# Patient Record
Sex: Female | Born: 1965 | Race: White | Hispanic: No | Marital: Married | Smoking: Current every day smoker
Health system: Southern US, Community
[De-identification: ages and names within clinical notes are randomized; demographics above are authoritative.]

## PROBLEM LIST (undated history)

## (undated) DIAGNOSIS — M1990 Unspecified osteoarthritis, unspecified site: Secondary | ICD-10-CM

## (undated) DIAGNOSIS — M797 Fibromyalgia: Secondary | ICD-10-CM

## (undated) DIAGNOSIS — J449 Chronic obstructive pulmonary disease, unspecified: Secondary | ICD-10-CM

## (undated) DIAGNOSIS — C569 Malignant neoplasm of unspecified ovary: Secondary | ICD-10-CM

## (undated) HISTORY — DX: Chronic obstructive pulmonary disease, unspecified: J44.9

## (undated) HISTORY — PX: KNEE ARTHROSCOPY: SUR90

## (undated) HISTORY — PX: TONSILLECTOMY AND ADENOIDECTOMY: SUR1326

## (undated) HISTORY — DX: Fibromyalgia: M79.7

## (undated) HISTORY — DX: Unspecified osteoarthritis, unspecified site: M19.90

## (undated) HISTORY — PX: UMBILICAL HERNIA REPAIR: SHX196

## (undated) HISTORY — PX: ABDOMINAL HYSTERECTOMY: SHX81

## (undated) HISTORY — PX: MELANOMA EXCISION: SHX5266

## (undated) HISTORY — PX: TUBAL LIGATION: SHX77

---

## 2000-11-02 ENCOUNTER — Encounter: Payer: Self-pay | Admitting: Obstetrics and Gynecology

## 2000-11-02 ENCOUNTER — Ambulatory Visit (HOSPITAL_COMMUNITY): Admission: RE | Admit: 2000-11-02 | Discharge: 2000-11-02 | Payer: Self-pay | Admitting: Obstetrics and Gynecology

## 2000-11-07 ENCOUNTER — Inpatient Hospital Stay (HOSPITAL_COMMUNITY): Admission: RE | Admit: 2000-11-07 | Discharge: 2000-11-09 | Payer: Self-pay | Admitting: Obstetrics and Gynecology

## 2000-11-11 ENCOUNTER — Inpatient Hospital Stay (HOSPITAL_COMMUNITY): Admission: AD | Admit: 2000-11-11 | Discharge: 2000-11-17 | Payer: Self-pay | Admitting: Obstetrics and Gynecology

## 2000-11-14 ENCOUNTER — Encounter: Payer: Self-pay | Admitting: Obstetrics and Gynecology

## 2000-11-15 ENCOUNTER — Encounter: Payer: Self-pay | Admitting: Obstetrics and Gynecology

## 2000-11-25 ENCOUNTER — Emergency Department (HOSPITAL_COMMUNITY): Admission: EM | Admit: 2000-11-25 | Discharge: 2000-11-25 | Payer: Self-pay | Admitting: *Deleted

## 2002-09-27 ENCOUNTER — Ambulatory Visit (HOSPITAL_COMMUNITY): Admission: RE | Admit: 2002-09-27 | Discharge: 2002-09-27 | Payer: Self-pay | Admitting: Pulmonary Disease

## 2003-07-24 ENCOUNTER — Ambulatory Visit (HOSPITAL_COMMUNITY): Admission: RE | Admit: 2003-07-24 | Discharge: 2003-07-24 | Payer: Self-pay | Admitting: Pulmonary Disease

## 2003-09-10 ENCOUNTER — Ambulatory Visit (HOSPITAL_COMMUNITY): Admission: RE | Admit: 2003-09-10 | Discharge: 2003-09-10 | Payer: Self-pay | Admitting: Orthopedic Surgery

## 2003-09-12 ENCOUNTER — Encounter (HOSPITAL_COMMUNITY): Admission: RE | Admit: 2003-09-12 | Discharge: 2003-10-12 | Payer: Self-pay | Admitting: Orthopedic Surgery

## 2003-10-16 ENCOUNTER — Encounter (HOSPITAL_COMMUNITY): Admission: RE | Admit: 2003-10-16 | Discharge: 2003-10-18 | Payer: Self-pay | Admitting: Orthopedic Surgery

## 2003-10-21 ENCOUNTER — Encounter (HOSPITAL_COMMUNITY): Admission: RE | Admit: 2003-10-21 | Discharge: 2003-11-20 | Payer: Self-pay | Admitting: Orthopedic Surgery

## 2005-02-07 ENCOUNTER — Emergency Department (HOSPITAL_COMMUNITY): Admission: EM | Admit: 2005-02-07 | Discharge: 2005-02-07 | Payer: Self-pay | Admitting: Emergency Medicine

## 2005-09-08 ENCOUNTER — Ambulatory Visit (HOSPITAL_COMMUNITY): Admission: RE | Admit: 2005-09-08 | Discharge: 2005-09-08 | Payer: Self-pay | Admitting: Pulmonary Disease

## 2005-09-16 ENCOUNTER — Ambulatory Visit (HOSPITAL_COMMUNITY): Payer: Self-pay | Admitting: Psychology

## 2005-10-13 ENCOUNTER — Ambulatory Visit (HOSPITAL_COMMUNITY): Payer: Self-pay | Admitting: Psychology

## 2005-10-26 ENCOUNTER — Ambulatory Visit (HOSPITAL_COMMUNITY): Payer: Self-pay | Admitting: Psychology

## 2005-11-17 ENCOUNTER — Ambulatory Visit (HOSPITAL_COMMUNITY): Payer: Self-pay | Admitting: Psychology

## 2006-07-11 ENCOUNTER — Ambulatory Visit (HOSPITAL_COMMUNITY): Admission: RE | Admit: 2006-07-11 | Discharge: 2006-07-11 | Payer: Self-pay | Admitting: Diagnostic Radiology

## 2006-09-22 ENCOUNTER — Ambulatory Visit (HOSPITAL_COMMUNITY): Admission: RE | Admit: 2006-09-22 | Discharge: 2006-09-22 | Payer: Self-pay | Admitting: Pulmonary Disease

## 2006-12-14 ENCOUNTER — Ambulatory Visit (HOSPITAL_COMMUNITY): Admission: RE | Admit: 2006-12-14 | Discharge: 2006-12-14 | Payer: Self-pay | Admitting: Pulmonary Disease

## 2007-10-23 ENCOUNTER — Ambulatory Visit (HOSPITAL_COMMUNITY): Admission: RE | Admit: 2007-10-23 | Discharge: 2007-10-23 | Payer: Self-pay | Admitting: Pulmonary Disease

## 2007-11-07 ENCOUNTER — Ambulatory Visit (HOSPITAL_COMMUNITY): Admission: RE | Admit: 2007-11-07 | Discharge: 2007-11-07 | Payer: Self-pay | Admitting: Pulmonary Disease

## 2007-11-23 ENCOUNTER — Ambulatory Visit (HOSPITAL_COMMUNITY): Admission: RE | Admit: 2007-11-23 | Discharge: 2007-11-23 | Payer: Self-pay | Admitting: Neurological Surgery

## 2008-07-23 ENCOUNTER — Ambulatory Visit (HOSPITAL_COMMUNITY): Admission: RE | Admit: 2008-07-23 | Discharge: 2008-07-23 | Payer: Self-pay | Admitting: Cardiology

## 2008-09-26 ENCOUNTER — Encounter: Payer: Self-pay | Admitting: Cardiology

## 2008-09-26 LAB — CONVERTED CEMR LAB
AST: 16 units/L (ref 0–37)
Albumin: 4.2 g/dL (ref 3.5–5.2)
Alkaline Phosphatase: 90 units/L (ref 39–117)
CO2: 23 meq/L (ref 19–32)
Calcium: 9 mg/dL (ref 8.4–10.5)
Cholesterol: 225 mg/dL — ABNORMAL HIGH (ref 0–200)
Eosinophils Relative: 3 % (ref 0–5)
HDL: 42 mg/dL (ref >39)
Hemoglobin: 14.2 g/dL (ref 12.0–15.0)
LDL Cholesterol: 133 mg/dL — ABNORMAL HIGH (ref 0–99)
Lymphocytes Relative: 25 % (ref 12–46)
MCHC: 31.9 g/dL (ref 30.0–36.0)
MCV: 89.9 fL (ref 78.0–100.0)
Monocytes Absolute: 1 10*3/uL (ref 0.1–1.0)
Monocytes Relative: 7 % (ref 3–12)
Neutro Abs: 8.9 10*3/uL — ABNORMAL HIGH (ref 1.7–7.7)
Neutrophils Relative %: 65 % (ref 43–77)
Platelets: 236 10*3/uL (ref 150–400)
RBC: 4.95 M/uL (ref 3.87–5.11)
RDW: 15.3 % (ref 11.5–15.5)
Rhuematoid fact SerPl-aCnc: 22 intl units/mL — ABNORMAL HIGH (ref 0–20)
Total CHOL/HDL Ratio: 5.4
VLDL: 50 mg/dL — ABNORMAL HIGH (ref 0–40)

## 2009-06-03 ENCOUNTER — Ambulatory Visit (HOSPITAL_COMMUNITY): Admission: RE | Admit: 2009-06-03 | Discharge: 2009-06-03 | Payer: Self-pay | Admitting: Pulmonary Disease

## 2009-06-05 ENCOUNTER — Ambulatory Visit (HOSPITAL_COMMUNITY): Admission: RE | Admit: 2009-06-05 | Discharge: 2009-06-05 | Payer: Self-pay | Admitting: Pulmonary Disease

## 2009-08-21 ENCOUNTER — Ambulatory Visit (HOSPITAL_COMMUNITY): Admission: RE | Admit: 2009-08-21 | Discharge: 2009-08-21 | Payer: Self-pay | Admitting: Pulmonary Disease

## 2009-11-10 ENCOUNTER — Ambulatory Visit: Payer: Self-pay | Admitting: Cardiology

## 2009-11-10 ENCOUNTER — Encounter: Payer: Self-pay | Admitting: *Deleted

## 2009-11-10 DIAGNOSIS — R0789 Other chest pain: Secondary | ICD-10-CM | POA: Insufficient documentation

## 2009-11-10 DIAGNOSIS — E785 Hyperlipidemia, unspecified: Secondary | ICD-10-CM | POA: Insufficient documentation

## 2009-11-11 ENCOUNTER — Encounter: Payer: Self-pay | Admitting: Cardiology

## 2009-11-11 ENCOUNTER — Encounter (INDEPENDENT_AMBULATORY_CARE_PROVIDER_SITE_OTHER): Payer: Self-pay

## 2009-11-13 ENCOUNTER — Encounter: Payer: Self-pay | Admitting: Cardiology

## 2009-11-17 ENCOUNTER — Encounter: Payer: Self-pay | Admitting: Cardiology

## 2009-11-18 ENCOUNTER — Encounter: Payer: Self-pay | Admitting: Cardiology

## 2009-11-18 LAB — CONVERTED CEMR LAB
Calcium: 9.2 mg/dL (ref 8.4–10.5)
Eosinophils Relative: 3 % (ref 0–5)
HCT: 43.8 % (ref 36.0–46.0)
INR: 1 (ref <1.50)
Monocytes Relative: 7 % (ref 3–12)
Neutro Abs: 10.7 10*3/uL — ABNORMAL HIGH (ref 1.7–7.7)
Neutrophils Relative %: 67 % (ref 43–77)
Prothrombin Time: 13.4 s (ref 11.6–15.2)
RBC: 5.07 M/uL (ref 3.87–5.11)
Sodium: 142 meq/L (ref 135–145)

## 2009-11-19 ENCOUNTER — Inpatient Hospital Stay (HOSPITAL_BASED_OUTPATIENT_CLINIC_OR_DEPARTMENT_OTHER): Admission: RE | Admit: 2009-11-19 | Discharge: 2009-11-19 | Payer: Self-pay | Admitting: Cardiovascular Disease

## 2009-11-19 ENCOUNTER — Ambulatory Visit: Payer: Self-pay | Admitting: Cardiovascular Disease

## 2010-02-08 ENCOUNTER — Encounter: Payer: Self-pay | Admitting: Pulmonary Disease

## 2010-02-09 ENCOUNTER — Encounter: Payer: Self-pay | Admitting: Pulmonary Disease

## 2010-02-17 NOTE — Letter (Signed)
Summary: [place] [employer] [facility], agricultural at [facility]  [address] [place] [address]   Phone: [phone]  Fax: [phone]      November 18, 2009 MRN: [id number]   Felicia Wallace [address] [place], [place]  [address]   Dear Ms. Stefanik,  Your test ordered by Selena Batten has been reviewed by your physician (or physician assistant) and was found to be normal or stable. Your physician (or physician assistant) felt no changes were needed at this time.  ____ Echocardiogram  ____ Cardiac Stress Test  __X__ Lab Work  ____ Peripheral vascular study of arms, legs or neck  ____ CT scan or X-ray  ____ Lung or Breathing test  ____ Other:  Please continue on current medical treatment.  Thank you.   Valera Castle, MD, F.A.C.C

## 2010-02-17 NOTE — Assessment & Plan Note (Signed)
Summary: NP6   Visit Type:  Initial Consult Primary Provider:  Dr.Hawkins  CC:  chest pain and sob.  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  It is not always made worse by exertion. However, it can sometimes be worse with activity when it is present at rest. Sometimes it can last hours on and. She's had no nausea, vomiting, or diaphoresis.  She has multiple cardiac risk factors including tobacco use, obesity, family history of coronary disease, and a severe mixed hyperlipidemia. Her last triglycerides were 457, HDL 34, total cholesterol 542.  She denies orthopnea, PND or edema. Impression is a lot of cervical vertebral problems and has had referred pain before into her left arm. This is more pronounced and different. She denies any difficulty swallowing, any symptoms of gastroesophageal reflux, no melena or hematochezia. She denies any pleuritic chest pain.   Current Medications (verified): 1)  Prozac 40 Mg Caps (Fluoxetine Hcl) .Marland Kitchen.. 1 Tablet By Mouth Once Daily 2)  Norco 5-325 Mg Tabs (Hydrocodone-Acetaminophen) .Marland Kitchen.. 1 Tablet Every 4 Hrs. As Needed 3)  Nexium 40 Mg Cpdr (Esomeprazole Magnesium) .... Take 1 Tablet By Mouth Two Times A Day 4)  Ketoprofen 50 Mg Caps (Ketoprofen) .... Take 1 Tablet By Mouth Every 8 Hrs. 5)  Topamax 25 Mg Tabs (Topiramate) .... Take 2 Tablets At Bedtime 6)  Aspirin 81 Mg Tbec (Aspirin) .... Take One Tablet By Mouth Daily 7)  Nitrostat 0.4 Mg Subl (Nitroglycerin) .Marland Kitchen.. 1 Tablet Under Tongue At Onset of Chest Pain; You May Repeat Every 5 Minutes For Up To 3 Doses.  Allergies (verified): 1)  ! * Pcn 2)  ! * Morphine 3)  ! * Ancef  Review of Systems       8 history of present illness  Vital Signs:  Patient profile:   45 year old female Height:      64 inches Weight:      267 pounds BMI:      46.00 O2 Sat:      97 % on Room air Temp:     97.9 degrees F Pulse rate:   78 / minute BP sitting:   117 / 64  (right arm)  Vitals Entered By: Dreama Saa, CNA (November 10, 2009 4:19 PM)  O2 Flow:  Room air  Physical Exam  General:  acute distress, skin warm and dry, overweight Head:  normocephalic and atraumatic Eyes:  PERRLA/EOM intact; conjunctiva and lids normal. Neck:  Neck supple, no JVD. No masses, thyromegaly or abnormal cervical nodes. Chest Wall:  no deformities or breast masses noted Lungs:  Clear bilaterally to auscultation and percussion. Heart:  PMI non-appreciated, normal S1-S2, no murmur, carotids equal bilaterally without bruits Msk:  decreased ROM.  decreased ROM.   Pulses:  pulses normal in all 4 extremities Extremities:  No clubbing or cyanosis. Neurologic:  Alert and oriented x 3. Skin:  Intact without lesions or rashes. Psych:  Normal affect.   Problems:  Medical Problems Added: 1)  Dx of Hyperlipidemia-mixed  (ICD-272.4) 2)  Dx of Chest Tightness-pressure-other  (HCW-237.62)  EKG  Procedure date:  11/10/2009  Findings:      normal sinus rhythm, RSR prime in lead V1, no ST segment changes.  Impression & Recommendations:  Problem # 1:  CHEST TIGHTNESS-PRESSURE-OTHER (ICD-786.59) Assessment New With her risk factors, I am  concerned about instructed coronary disease. I've asked her to start an aspirin daily and will arrange a catheter on q. day November 2. Asked Dr. Calton Dach to do it to hopefully do a right radial. She has good ulnar and radial blood flow by Freida Busman test. She is also been given sublingual nitroglycerin and strict instructions on how to use it as well as activate 911. Once the catheter is done, we'll deal with cardiac risk factors. She is already tried to lose weight. Her updated medication list for this problem includes:    Aspirin 81 Mg Tbec (Aspirin) .Marland Kitchen... Take one tablet by mouth daily    Nitrostat 0.4 Mg Subl  (Nitroglycerin) .Marland Kitchen... 1 tablet under tongue at onset of chest pain; you may repeat every 5 minutes for up to 3 doses.  Problem # 2:  HYPERLIPIDEMIA-MIXED (ICD-272.4) She has been advised to be on a low carbohydrate diet. Weight loss is essential. She may be O. tolerate fenofibrate.  Problem # 3:  OVERWEIGHT/OBESITY (ICD-278.02)  Other Orders: T-Basic Metabolic Panel [phone]) T-CBC w/Diff [phone]) T-Protime, Auto [phone]) T-PTT [phone])  Patient Instructions: 1)  Your physician recommends that you schedule a follow-up appointment in: post cath 2)  Your physician recommends that you return for lab work in: today 3)  Your physician has recommended you make the following change in your medication: Start taking Aspirin 81mg  by mouth once daily and Nitroglycerin as needed for chest pain  4)  A chest x-ray takes a picture of the organs and structures inside the chest, including the heart, lungs, and blood vessels. This test can show several things, including, whether the heart is enlarged; whether fluid is building up in the lungs; and whether pacemaker / defibrillator leads are still in place. 5)  Your physician has requested that you have a cardiac catheterization.  Cardiac catheterization is used to diagnose and/or treat various heart conditions. Doctors may recommend this procedure for a number of different reasons. The most common reason is to evaluate chest pain. Chest pain can be a symptom of coronary artery disease (CAD), and cardiac catheterization can show whether plaque is narrowing or blocking your heart's arteries. This procedure is also used to evaluate the valves, as well as measure the blood flow and oxygen levels in different parts of your heart.  For further information please visit [email or web address]  Please follow instruction sheet, as given. Prescriptions: NITROSTAT 0.4 MG SUBL (NITROGLYCERIN) 1 tablet under tongue at onset of chest pain; you may repeat every 5  minutes for up to 3 doses.  #25 x 3   Entered by:   Larita Fife Via LPN   Authorized by:   Gaylord Shih, MD, [facility]   Signed by:   Larita Fife Via LPN on 08/67/6195   Method used:   Electronically to        [facility]* (retail)       [address]       [address] Shipping/mailing       [place], [place]  [address]       Ph: [phone]       Fax: [phone]   RxID:   [id number]

## 2010-02-17 NOTE — Letter (Signed)
Summary: Cardiac Catheterization Instructions- [facility] Lab  [facility]  [address], [place] [address]   Phone: [phone]  Fax: [phone]     11/11/2009 MRN: [id number]  [facility] [address] [place], [place]  [address]  Dear Ms. Giaimo,   You are scheduled for a Cardiac Catheterization on 11-20-2010 with Dr.Cooper Please arrive to the 1st floor of the Heart [facility] Center at [facility] at 8:30 am on the day of your procedure. Please do not arrive before 6:30 a.m. Call the Heart and Vascular Center at [phone] if you are unable to make your appointmnet. The Code to get into the parking garage under the building is 2000. Take the elevators to the 1st floor. You must have someone to drive you home. Someone must be with you for the first 24 hours after you arrive home. Please wear clothes that are easy to get on and off and wear slip-on shoes. Do not eat or drink after midnight except water with your medications that morning. Bring all your medications and current insurance cards with you.  ___ DO NOT take these medications before your procedure: ________________________________________________________________  _X__ Make sure you take your aspirin.  ___ You may take ALL of your medications with water that morning. ________________________________________________________________________________________________________________________________  ___ DO NOT take ANY medications before your procedure.  ___ Pre-med instructions:  ________________________________________________________________________________________________________________________________  The usual length of stay after your procedure is 2 to 3 hours. This can vary.  If you have any questions, please call the office at the number listed above.   Larita Fife Via LPN

## 2010-02-17 NOTE — Miscellaneous (Signed)
Summary: Orders Update - Cath  Clinical Lists Changes  Orders: Added new Referral order of Cardiac Catheterization (Cardiac Cath) - Signed 

## 2010-06-02 NOTE — Op Note (Signed)
NAMEDESERE, GWIN               ACCOUNT NO.:  [id number]   MEDICAL RECORD NO.:  [id number]          PATIENT TYPE:  OIB   LOCATION:  3537                         FACILITY:  [facility]   PHYSICIAN:  Stefani Dama, M.D.  DATE OF BIRTH:  1965-05-26   DATE OF PROCEDURE:  11/23/2007  DATE OF DISCHARGE:  11/23/2007                               OPERATIVE REPORT   PREOPERATIVE DIAGNOSIS:  Herniated nucleus pulposus at C3-C4 on the left  with cervical radiculopathy.   POSTOPERATIVE DIAGNOSIS:  Herniated nucleus pulposus at C3-C4 on the  left with cervical radiculopathy.   PROCEDURE:  Cervical microdiskectomy at C3-C4 on the left with operating  microscope Matrix system in sitting position.   SURGEON:  Stefani Dama, MD   FIRST ASSISTANT:  Cristi Loron, MD   ANESTHESIA:  General endotracheal.   INDICATIONS:  Ms. Felicia Wallace is a 45 year old individual who has had  significant neck, shoulder, and left arm pain proximally in the shoulder  mostly.  She was found to have an extruded fragment of disk at C3-C4 on  the left side.  Having failed efforts at conservative treatment for  several months, she was advised regarding surgical intervention.   PROCEDURE:  The patient was brought to the operating room and placed on  the table in supine position.  After smooth induction of general  endotracheal anesthesia, she was placed in the 3-pin headrest and then  carefully brought to the sitting position.  She had already had a  central monitoring catheter placed for atrial aspiration if necessary.  The back of the neck was then prepped with alcohol and DuraPrep and  draped in a sterile fashion.  Fluoroscopy was brought in to use a cross-  table lateral imaging to localize the C3-C4 level.  The appropriate area  was noted by a local injection with a needle of local anesthetic and  then a small incision was created over the back of the neck right where  the needle localization was created  and then a K-wire was placed down to  the interlaminar space at C3-C4.  A series of dilators were passed over  this as the K-wire opening was enlarged with #10 blade.  The dissection  was performed in the soft tissues to the 18 mm diameter and then an 18  mm x 5 cm deep endoscopic cannula of the Matrix already was placed into  the opening and secured to the operating table with a clamp.  The  microscope was draped and brought into the field at this point and the  soft tissues above the interlaminar space at C3-C4 were cleared using  monopolar cautery.  Laminotomy was then created using a Stryker drill to  remove the inferior margin of lamina of C3 to the facet joint and also  performing a medial facetectomy removing also the superior margin of  lamina and facet at C4.  The opening was then carefully inspected and  the yellow ligament was opened with a 3-mm Kerrison punch.  It was cut  back to the region of the epidural veins.  The veins  were cauterized and  divided and then by dissecting carefully along the dorsal surface of the  C4 nerve root.  The dissection was carried out into the region of the  foramen.  The nerve root ultimately could be mobilized from underneath.  A small probe was placed and a small Cytogeneticist was used to lift  the C4 nerve root and retracted slightly medially in this area and then  it was found some fragments of disk material and these were removed in a  piecemeal fashion.  A series of small ball dissectors were then used to  remove what residual disk material there may have been.  A total of  about 5 fragments of disk material were removed in this fashion.  At the  end, the path of the C4 nerve root was noted to be much more free and  clear.  Epidural bleeding in this area was controlled with bipolar  cautery and some small pledgets of Gelfoam soaked in thrombin which were  ultimately irrigated away and then once the inspection revealed that the   procedure was complete with no other fragments of disk being found we  removed the Matrix tube and then closed the fascia and the subcuticular  skin with 3-0 Vicryl.  Dermabond was used on the skin.  Blood loss for  the procedure was essentially nil.      Stefani Dama, M.D.  Electronically Signed     HJE/MEDQ  D:  11/23/2007  T:  11/24/2007  Job:  [id number]

## 2010-06-05 NOTE — H&P (Signed)
[facility]  Patient:    Felicia Wallace, Felicia Wallace Visit Number: [id number] MRN: [id number]          Service Type: MED Location: 4A A420 01 Attending Physician:  Tilda Burrow Dictated by:   Christin Bach, M.D. Admit Date:  11/11/2000   CC:         Franky Macho, M.D.   History and Physical  This is a redictation.  ADMITTING DIAGNOSES:  Fever, rule out postop wound infection, pharyngitis/thrush.  HISTORY OF PRESENT ILLNESS:  This 45 year old female five days status post abdominal hysterectomy, panniculectomy and incisional herniorrhaphy was admitted after having temperature to 103 noted by patient at home. She was discharged on postoperative day two and is now five days status post the surgery. She was afebrile until today when she awoke with shaking chills and temperature recorded at 103, 100.8 when arrival at the office. She had continued low back ache since the surgery itself and has developed a severe sore throat with white exudate on the uvula during the past 24 hours.  PAST MEDICAL HISTORY:  Prediabetes, morbid obesity since childhood.  PHYSICAL EXAMINATION:  GENERAL:  Reveals a pale, overweight, Caucasian female with tachypnea in the high 20s, alert and oriented x 3 who appears pale, in obvious discomfort and somewhat anxious.  HEENT:  Pupils are equal, round and reactive. Extraocular movements intact. Pharynx showing thrush on the uvula, tonsils are normal in size.  LUNGS:  There is no respiratory difficulty.  CHEST:  Clear to auscultation.  CARDIAC:  Regular rate and rhythm, pulse 90s.  ABDOMEN:  Soft. There is no guarding. There is no swelling of the incision and there is appropriate tiny pinpoint erythema at each staple. The umbilicus has had some inversion of the incision and has a small amount of superficial discharge related to eschar formation but there is no fluctuance or distinct evidence of an infection in the incision at this  time.  LABORATORY DATA:  On admission includes hemoglobin 11.9, hematocrit 34.1, white count 21,700 with 12 bands, 74 neutrophils. Electrolytes are within normal limits except elevated glucose of 119. This is a random blood glucose.  PHYSICAL EXAMINATION:  ASSESSMENT:  Rule out wound infection, pharyngitis suspected to represent thrush.  PLAN:  Dukes mixture oral injection IV antibiotics in hospital setting, consider CT scan and further evaluation depending on response to antibiotic therapy. Dictated by:   Christin Bach, M.D. Attending Physician:  Tilda Burrow DD:  11/13/00 TD:  11/13/00 Job: [id number]

## 2010-06-05 NOTE — Consult Note (Signed)
[facility]  Patient:    Felicia Wallace, Felicia Wallace Visit Number: [id number] MRN: [id number]          Service Type: EMS Location: ED Attending Physician:  Rosalyn Charters Dictated by:   Christin Bach, M.D. Proc. Date: 11/25/00 Admit Date:  11/25/2000 Discharge Date: 11/25/2000                            Consultation Report  ADMITTING DIAGNOSIS:  Wound dehiscence.  HISTORY:  Patient seen in the emergency room after sitting down to use the bathroom to defecate and experiencing a separation of the incision, with the exam revealing the incision to separate approximately 20 cm vertical length through the lowest part of the panniculus, to a depth of about 8 cm.  The fascia is not visible.  There is at least another 8 cm of fat.  Patient has put nothing on it.  Earlier today, she was seen in the office and had a small subcutaneous drainage from a granulation tissue area.  This was treated with silver nitrate.  Remainder of the incision looked excellent.  Patient was advised to use a girdle on a regular basis, but of course this was removed as a part of preparing to use the bathroom.  PHYSICAL EXAMINATION:  ABDOMEN:  Physical exam shows no purulence or active infection with a clean well-vascularized wound separation.  DESCRIPTION OF PROCEDURE:  Local anesthesia is used on the skin level to allow placement of three retention sutures of 0 Prolene, which pulled the tissue into reasonable reapproximation, then a series of interrupted sutures and continuous brief sections of continuous sutures used to reapproximate tissue edges with excellent tissue approximation eventually.  Total operative time probably 30 to 35 minutes.  The closure was a two-layer closure of the retention sutures plus the subcu sutures.  The patient then was given prescriptions for Cipro 500 mg b.i.d. x 7 days and Tylox, 30 tablets, one q.4h. p.r.n. pain and will be followed up in four days for  incision recheck. Dictated by:   Christin Bach, M.D. Attending Physician:  Rosalyn Charters DD:  11/25/00 TD:  11/28/00 Job: [id number]

## 2010-06-05 NOTE — Op Note (Signed)
NAMEBO, ROGUE                          ACCOUNT NO.:  [id number]   MEDICAL RECORD NO.:  [id number]                   PATIENT TYPE:  AMB   LOCATION:  DAY                                  FACILITY:  [facility]   PHYSICIAN:  Vickki Hearing, M.D.           DATE OF BIRTH:  Feb 06, 1965   DATE OF PROCEDURE:  09/10/2003  DATE OF DISCHARGE:                                 OPERATIVE REPORT   INDICATIONS FOR PROCEDURE:  Rebacca Votaw is a 45 years old and complains of  right knee pain with mechanical symptoms.  She had an MRI which showed a  medial meniscal tear and arthritis of the knee.  She presented for medial  meniscectomy and arthroscopic evaluation of her knee pain.  Primary  indication was pain and mechanical symptoms.   PREOPERATIVE DIAGNOSIS:  Medial meniscal tear and arthritis, right knee.   POSTOPERATIVE DIAGNOSES:  1. Severe arthritis, right knee.  2. Grade 3 chondral lesion, lateral femoral condyle, with flap tear.  3. Grade 3 lesion, medial femoral condyle.  4. Grade 3 lesion, trochlea.  5. The meniscus was intact.   FINDINGS:  There was a chondral flap tear on the lateral femoral condyle  which was full-thickness down to bone.  There was grade 3 erosion of the  medial femoral condyle.  There was grade 3 erosion of the trochlea.  The  lateral meniscus had some fraying of the free edge but no distinct tear.  The ACL and PCL were intact.   DESCRIPTION OF PROCEDURE:  Emilyann Banka was identified in the preoperative  holding area and given clindamycin due to Ancef allergy.  Her right knee was  marked as the surgical site by the patient and by the surgeon.  The chart  was reviewed to check the history and physical and consent which both  indicated that arthroscopy of the right knee was to be performed.  She was  taken to the operating room for general anesthesia after which we prepped  and draped her right knee in sterile technique.  We then took a timeout,  confirmed our  procedure, patient identify, and extremity.  Everyone in the  OR agreed, and we proceeded with a two portal diagnostic arthroscopy of the  right knee.  We examined all of the structures in the knee by visualization and palpation  with a probe.   We addressed the medial femoral condyle first.  We did a chondroplasty using  a combination of straight 4-0 shaver and a 90-degree arthroscopic wand.  Of  note, there was also a fissure in the tibial plateau which created a kissing  lesion of the medial femoral condyle.  There was a bone spur on the medial  femoral condyle's edge as well.  The medial gutter was normal.   We addressed the notch which showed normal structures.   We addressed the lateral compartment which showed a full-thickness lateral  femoral condylar  flap tear which was full-thickness down to bone.  We  addressed this with an ArthroCare wand on the edges to seal them and contour  them and then took a chondral pick and created six subchondral penetrations  to stimulate cartilaginous growth.   We then went into the patellofemoral compartment and addressed the trochlea  with a shaver and 90-degree wand.  We suctioned the knee dry, washed it out,  and injected 30 cc of Marcaine.  We closed the portals with Steri-Strips,  sterile dressing, and a CryoCuff.   The patient was extubated and taken to the recovery room in stable  condition.  We will place her in a knee brace, allow her to be weightbearing  in extension, with crutches and brace for four to six weeks.  Motion can  start immediately.      ___________________________________________                                            Vickki Hearing, M.D.   SEH/MEDQ  D:  09/10/2003  T:  09/10/2003  Job:  [id number]

## 2010-06-05 NOTE — Op Note (Signed)
[facility]  Patient:    FUJIKO, PICAZO Visit Number: [id number] MRN: [id number]          Service Type: SUR Location: 4A A417 01 Attending Physician:  Tilda Burrow Dictated by:   Franky Macho, M.D. Proc. Date: 11/07/00 Admit Date:  11/07/2000   CC:         Christin Bach, M.D.  Kari Baars, M.D.   Operative Report  PATIENT AGE:  45 years.  PREOPERATIVE DIAGNOSIS:  Incisional hernia.  POSTOPERATIVE DIAGNOSIS:  Incisional hernia.  OPERATION:  Incisional herniorrhaphy.  SURGEON:  Franky Macho, M.D.  ASSISTANT:  Christin Bach, M.D.  ANESTHESIA:  General endotracheal anesthesia.  INDICATIONS:  The patient is a 45 year old white female who presents with an incisional hernia just above the umbilicus.  She is undergoing a hysterectomy at the same time.  The incisional hernia is to be fixed at the time of hysterectomy.  The risks and benefits of the procedure including bleeding, infection, and recurrence of the hernia were fully explained to the patient who gave informed consent.  DESCRIPTION OF PROCEDURE:  The patient was already on the operating room table with a midline incision present from the hysterectomy.  Once the hysterectomy was finished, the incision was extended superiorly to the hernia defect which measured approximately 3 to 4 cm in its greatest diameter.  The hernia sac was excised without difficulty.  The fascial edges were then cleaned of any scar tissue.  The hernia was then repaired superior to inferior using #1 Novofil Jones sutures.  The hernia defect was closed without difficulty.  It was a separate hernia from the lower midline incision.  Dr. Emelda Fear then proceeded to close the lower midline incision.  COMPLICATIONS:  None.  SPECIMEN:  None.  ESTIMATED BLOOD LOSS:  Minimal. Dictated by:   Franky Macho, M.D. Attending Physician:  Tilda Burrow DD:  11/07/00 TD:  11/08/00 Job: [id number]

## 2010-06-05 NOTE — H&P (Signed)
[facility]  Patient:    Felicia Wallace, Felicia Wallace Visit Number: [id number] MRN: [id number]          Service Type: MED Location: 4A A420 01 Attending Physician:  Tilda Burrow Dictated by:   Christin Bach, M.D. Admit Date:  11/11/2000                           History and Physical  INCOMPLETE DICTATION.  This is a repeat dictation.  ADMITTING DIAGNOSES:  Postoperative fever, rule out wound infection, pharyngitis/thrush.  HISTORY OF PRESENT ILLNESS:  This 45 year old female five days status post total abdominal hysterectomy and panniculectomy with incisional herniorrhaphy was seen in the office on the afternoon of November 11, 2000 complaining of temperature to 103 at home. The patient had reported to be afebrile in good condition at home Dictated by:   Christin Bach, M.D. Attending Physician:  Tilda Burrow DD:  11/13/00 TD:  11/13/00 Job: [id number]

## 2010-06-05 NOTE — H&P (Signed)
Felicia Wallace, Felicia Wallace                          ACCOUNT NO.:  [id number]   MEDICAL RECORD NO.:  [id number]                   PATIENT TYPE:  AMB   LOCATION:  DAY                                  FACILITY:  [facility]   PHYSICIAN:  Vickki Hearing, M.D.           DATE OF BIRTH:  1965/04/03   DATE OF ADMISSION:  DATE OF DISCHARGE:                                HISTORY & PHYSICAL   CHIEF COMPLAINT:  Right knee pain.   SHORT-STAY HISTORY:  A 45 year old female with diffuse right knee pain  associated with giving way.   ALLERGIES:  PENICILLIN, MORPHINE, ANCEF.   REVIEW OF SYSTEMS:  Breathing difficulty, migraine, cancer of the skin.   PAST MEDICAL HISTORY:  Asthma.   SURGEON:  1. Cholecystectomy.  2. Tubal ligation.  3. Wide excision of melanoma.  4. Tonsillectomy.  5. Hysterectomy.  6. Abdominal hernia repair with revision.   PHARMACY:  [facility].   MEDICATIONS:  1. Combivent as needed.  2. Darvocet.  3. Valium p.r.n.  4. Toradol p.r.n.  5. Prozac 20 mg a day.   FAMILY HISTORY:  Heart and kidney disease, asthma, arthritis, and cancer.   FAMILY PHYSICIAN:  Dr. Juanetta Gosling.   SOCIAL HISTORY:  Single. 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 Smoking:  Yes, one pack per day. Alcohol use:  Yes, social. Caffeine use:  No. Highest  grade completed:  Associate's degree in nursing.   PHYSICAL EXAMINATION:  VITAL SIGNS:  Weight 170, pulse 74, respiratory rate  20.  HEENT:  Normal.  NECK:  Supple.  EXTREMITIES:  She has a normal gait pattern. She has a tender medial  compartment. She has positive meniscal signs although they are subtle such  as McMurray's and the screw-home test. She has normal strength and stability  to the knee. Her findings are consistent with medial meniscal tear. Skin  over the knee normal.  NEUROLOGICAL:  She has normal sensation. She is awake and alert. No  depression, mood swings, or anxiety.  CARDIOVASCULAR:  Normal.   RADIOLOGIC FINDINGS:  MRI shows  torn medial meniscus, right knee.   PLAN:  Arthroscopy, right knee.     ___________________________________________                                         Vickki Hearing, M.D.   SEH/MEDQ  D:  09/09/2003  T:  09/09/2003  Job:  [id number]

## 2010-06-05 NOTE — H&P (Signed)
[facility]  Patient:    Felicia Wallace, Felicia Wallace Visit Number: [id number] MRN: [id number]          Service Type: DSU Location: DAY Attending Physician:  Tilda Burrow Dictated by:   Christin Bach, M.D. Admit Date:  11/07/2000                           History and Physical  ADMITTING DIAGNOSES:  1. Menorrhagia, unresponsive to medical therapy.  2. Ventral hernia.  3. Obesity with panniculus.  4. Cigarette use.  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 Franky Macho during this surgery. Felicia Wallace has been followed through our office for several years. In 2000, she had suspected endometrial polyp explaining her heavy periods but hysteroscopy D&C did not correct the periods. She has had her tubes tied. She was placed on birth control pills and did okay on these even despite, with the pills being used for cycle regulation. She has a since of malaise and blues while on the pills and "hates the pill" referring to the way she feels on them. Additionally the patient is a smoker so she has reached 35 she must stop birth control pills for that reason. After a discussion of the options of treatment options including referral for endometrial oblation, the patient prefers to proceed toward hysterectomy. The patient is additionally in need of repair of a ventral hernia which has persisted. The hernia was first noted after a laparoscopic cholecystectomy and was noted in the area above the umbilicus. At the time of tubal ligation, efforts were made to repair this hernia which was found to be quite large measuring 8-9 cm in diameter at the time. Herniorrhaphy was not successful and the hernia has recurred. Plans are to have Dr. Franky Macho perform this portion of the procedure and she will undergo mesh placement as well as  repair of the umbilical hernia defect itself. The patient is aware that her obesity precludes her to additional problems. The smoking is a big concern.  PAST MEDICAL HISTORY: 1. Positive empty sella syndrome not requiring endocrine management with    normal thyroid function and cortisol levels. 2. Asthma with intermittent bronchitis, with recent upper GI performed t    rule out hiatal hernia with no evidence of hiatal hernia found.  PAST SURGICAL HISTORY: 1. 9 mm melanoma excised in 1998 with negative follow-up and negative    sentinel node biopsy. 2. Lipoma excision, left buttock, 2000. 3. D&C, tubal ligation, and ventral hernia repair, September 2001. 4. Laparoscopic cholecystectomy, 1998.  HABITS: 1. Cigarettes 1/2 to 1 pack per day. 2. Alcohol and recreational drugs denied.  REVIEW OF SYSTEMS:  Recent 25 pound weight gain from 315 to 283 over the past year.  PHYSICAL EXAMINATION:  VITAL SIGNS:  Blood pressure 110/70, pulse 76.  GENERAL:  Showing an obese Caucasian female who is in approximately her lowest weight since high school.  HEENT:  Pupils are equal, round and reactive. Extraocular movements intact.  NECK:  Supple, trachea midline.  CHEST:  Clear to auscultation with slight hoarseness of her voice recently worsened by the bronchitis. Chest x-ray negative for evidence of pneumonia. Chest x-ray read is minimal bronchitic changes versus asthma. There are no wheezes or rhonchi.  ABDOMEN:  Obese with well healed supraumbilical incision and a generous panniculus. Plan excision  of a generous midline ellipse of skin with some reduction of the panniculus resulting. A full panniculectomy is not planned to reduce risks for postop complications.  PELVIC:  Well supported, not a  candidate for vaginal procedure, anterior adnexa negative for masses.  EXTREMITIES:  Grossly normal with calf pain in the past considered chronic muscle discomfort.  PLAN:  Total abdominal  hysterectomy, partial panniculectomy on October 21 with ventral hernia repair by Dr. Franky Macho. Dictated by:   Christin Bach, M.D. Attending Physician:  Tilda Burrow DD:  11/06/00 TD:  11/07/00 Job: [id number]

## 2010-06-05 NOTE — Op Note (Signed)
[facility]  Patient:    SACRED, ROA Visit Number: [id number] MRN: [id number]          Service Type: EMS Location: ED Attending Physician:  Rosalyn Charters Dictated by:   Christin Bach, M.D. Proc. Date: 11/07/00 Admit Date:  11/25/2000 Discharge Date: 11/25/2000                             Operative Report  PREOPERATIVE DIAGNOSES:  Menorrhagia unresponsive to medical therapy, ventral hernia (postoperative), obesity with panniculus, cigarette use.  POSTOPERATIVE DIAGNOSES:  Menorrhagia, ventral hernia (incisional hernia), obesity with panniculus.  PROCEDURE:  Total abdominal hysterectomy, partial panniculectomy, repair of incisional hernia.  SURGEONS: Christin Bach, M.D. Franky Macho, M.D.  ASSISTANT:  ______ .  ANESTHESIA:  General.  COMPLICATIONS:  None.  ESTIMATED BLOOD LOSS:  200 cc.  FINDINGS:  Mobile uterus, normal ovaries, 4-cm hernia at site of laparoscopic cholecystectomy.  DETAILS OF PROCEDURE:  Patient was taken to the operating room and prepped and draped for abdominal surgery with a vertical incision planned.  A wide football-shaped incision was performed from the umbilicus to just above the panniculus crease, removing approximately a 10-cm wide x 20-cm long x 10-cm deep ellipse of skin and underlying fatty tissue.  The ellipse of skin was taken out with underlying fatty tissue and removed and sent for weighing, which was returned 680 g.  The patient had point cautery or suture ligature of bleeding vessels performed to adequately improve hemostasis.  The vertical midline abdominal incision was performed, with easy entry into the peritoneal cavity under standard careful technique, and the bowel elevated and packed away.  The uterus was easily visualized. The hysterectomy proceeded in standard fashion with take-down of the round ligaments bilaterally with transection between two suture ligatures of 0 Prolene, bladder flap  developed anteriorly.  The connective tissue was very loose and easily mobile.  Inspection into the retroperitoneum identified the ureters on each side and confirmed them as being out of harms way.  The ovaries appeared normal so we left them.  The uteroovarian ligaments were clamped, cut and suture-ligated on either side, uterus vessels skeletonized and curved Zeppelin clamp used to cross-clamp the uterine vessels, followed by transection.  Suture ligature was 2-0 chromic.  The rest of the uterus was then taken down in serial bites, down to the level of the cuff, with removal of the cervix, a vertical midline closure of the vaginal cuff.  We then proceeded to pull the cardinal ligaments into the midline with tying of the Aldrich stitches into the midline; this resulted in more improved midline support.  The bowel equipment was removed, the anterior peritoneum closed using 2-0 chromic after Dr. Lovell Sheehan was allowed to complete his portion of the procedure; see his dictation elsewhere.  Once the peritoneum was closed with apparent correct lap counts, fascia was closed in a continuous running 0 Prolene fashion and then the subcu fatty tissues reapproximated with large vertical mattress sutures as well as plenty of 2-0 plain sutures to reapproximate the skin edges.  Upon reaching the level of the skin, staple closure allowed appropriate closure.  Patient tolerated the procedure well and went to the recovery room in good condition with 800 cc of blood loss. Dictated by:   Christin Bach, M.D. Attending Physician:  Rosalyn Charters DD:  12/04/00 TD:  12/05/00 Job: [id number]

## 2010-06-05 NOTE — Discharge Summary (Signed)
[facility]  Patient:    Felicia Wallace, Felicia Wallace Visit Number: [id number] MRN: [id number]          Service Type: MED Location: 4A A420 01 Attending Physician:  Tilda Burrow Dictated by:   Christin Bach, M.D. Admit Date:  11/11/2000 Discharge Date: 11/17/2000                             Discharge Summary  ADMITTING HISTORY:  Fever, rule out postoperative wound infection, pharyngitis.  DISCHARGE DIAGNOSES:  Postoperative wound infection--recovered, pharyngitis, thrush--recovered.  DISCHARGE MEDICATIONS: 1. Metronidazole 500 mg p.o. b.i.d. x 7 days. 2. Doxycycline 100 mg p.o. b.i.d. x 7 days. 3. Tylox one q.4h. p.r.n. pain.  HISTORY OF PRESENT ILLNESS:  This 45 year old female is five days status post abdominal hysterectomy and panniculectomy with incisional herniorrhaphy status post a previous laparoscopic cholecystectomy.  Was admitted after having a temperature to 103 noted with the patient at home.  She had been discharged on postoperative day #2 afebrile with an uncomplicated course and was now five days after the surgery.  Patient had temperature to 103 at home, 100.8 upon arrival to the office with low back ache since the surgery, severe sore throat with white exudate on the uvula noted in the past 24 hours.  PAST MEDICAL HISTORY:  Pre diabetes, morbid obesity since childhood.  PAST SURGICAL HISTORY: 1. Laparoscopic cholecystectomy with subsequent hernia at the umbilical port    site. 2. Laparoscopic tubal sterilization with attempted herniorrhaphy complicated    by postoperative wound infection which resulted in breakdown of the repair    and hernia reoccurrence.  PHYSICAL EXAMINATION  GENERAL:  Pale, overweight Caucasian female tachypneic in the high 20s, alert, oriented, in obvious discomfort, and anxious.  HEENT:  Thrush on the pharynx and uvula.  Tonsils:  Normal in size.  ______ difficulty.  CARDIOVASCULAR:   Unremarkable.  ABDOMEN:  Soft without guarding.  There is no swelling at the incision but there is erythema at each staple insertion site.  The umbilicus has some eschar formation due to irregular skin edge reapproximation.  LABORATORIES:  Hemoglobin 11.9, hematocrit 34, white count 21,700, 12 bands, 74 neutrophils.  HOSPITAL COURSE:  The patient was admitted, placed on IV antibiotics Cleocin 600 mg IV q.6h. and Cipro 400 mg IV q.12h.  The patient also had blood cultures performed and was given oral Diflucan.  She then had Dukes mixture added for swish and swallow for the oral discomfort.  She had a temperature noted on the first two days into the 101 range with temperature November 13, 2000 of 101.4 on one occasion.  Pulse remained in the 80s.  The patients incision remained stable in appearance.  Evaluation, because of her obesity, required more diagnostic testing and a CT scan showed a small loculation of fluid in the pelvis which was considered to have fluid and possibly to have air pockets.  It was remembered by Dr. Lovell Sheehan that the surgery involved placing some Surgicel at the vaginal cuff which can trap air and give this radiographic appearance.  There was a small loculation in the incision only 2.5 cm in size so this was observed.  The patients incision remained fine. Half the staples were taken out on hospital day #2 and then the patient kept for an additional three more days.  She was afebrile x 24 hours on November 17, 2000 at which time she was postoperative day #10 from her original  surgery. She was discharged home for followup in one week in our office.  ADDENDUM:  Blood cultures returned negative and hemoglobin dropped slightly with white count remaining at 16,900 at the time of discharge despite the afebrile status. Dictated by:   Christin Bach, M.D. Attending Physician:  Tilda Burrow DD:  11/17/00 TD:  11/17/00 Job: [id number]

## 2010-06-05 NOTE — H&P (Signed)
[facility]  Patient:    Felicia Wallace, Felicia Wallace Visit Number: [id number] MRN: [id number]          Service Type: MED Location: 4A A420 01 Attending Physician:  Tilda Burrow Dictated by:   Christin Bach, M.D. Admit Date:  11/11/2000                           History and Physical  ADMISSION DIAGNOSIS: Pharyngitis, thrush, rule out wound infection.  HISTORY OF PRESENT ILLNESS: This is a 45 year old female, five days status post total abdominal hysterectomy and panniculectomy with incisional herniorrhaphy at the site of an old prior laparoscopic cholecystectomy supraumbilical incision, admitted for IV antibiotic therapy due to suspected early wound infection.  The patient is doing well, having been discharged on November 09, 2000, postoperatively afebrile and without fever or chills at home until she woke up this afternoon with shaking chills and temperature of 103 degrees at home, and recorded at 100.8 degrees in our office.  She has continued low back ache, not significantly different from her menstrual periods before the surgery.  She has a severely sore throat and usually gets thrush with each antibiotic regimen.  The patient received 24 hours of antibiotic prophylaxis at the time of surgery.  PAST MEDICAL HISTORY: Prediabetes.  PAST SURGICAL HISTORY:  1. Laparoscopic cholecystectomy.  2. Laparoscopic tubal sterilization with repair of umbilical herniorrhaphy,     repair of umbilical incisional defect which broke down due to wound     infection.  3. Total abdominal hysterectomy and partial panniculectomy on November 07, 2000.  PHYSICAL EXAMINATION:  GENERAL: Somber, anxious Caucasian female in no acute distress, mildly tachypneic.  VITAL SIGNS: Respiration rate in 20s.  HEENT: PERRL.  EOMI.  Pharynx shows thrush on the uvula.  NECK: Lymph nodes in front are nontender and the patient has a supple neck.  CHEST: Clear to  auscultation.  ABDOMEN: Soft, without guarding.  The patient is quite obese but there is no swelling of the incision and mobility of the incision does not cause increased pain.  There is appropriate erythema at each staple.  PELVIC: Examination deferred.  IMPRESSION: Pharyngitis thrush.  Rule out abdominal wound infection.  PLAN: IV antibiotics, p.o. Diflucan, blood cultures.  Recheck q.d. Dictated by:   Christin Bach, M.D. Attending Physician:  Tilda Burrow DD:  11/11/00 TD:  11/12/00 Job: [id number]

## 2010-06-05 NOTE — Discharge Summary (Signed)
[facility]  Patient:    Felicia Wallace, Felicia Wallace Visit Number: [id number] MRN: [id number]          Service Type: SUR Location: 4A A417 01 Attending Physician:  Tilda Burrow Dictated by:   Christin Bach, M.D. Admit Date:  11/07/2000 Discharge Date: 11/09/2000   CC:         Franky Macho, M.D.   Discharge Summary  ADMITTING DIAGNOSES:  Menorrhagia unresponsive to medical therapy, ventral hernia, obesity with panniculus, cigarette use.  DISCHARGE DIAGNOSES:  Menorrhagia, ventral hernia (incisional hernia), obesity with panniculus.  SECONDARY DIAGNOSES:  Cigarette use, history of empty sella syndrome, history of melanoma.  DISCHARGE MEDICATIONS:  Darvocet-N 100 two q.4h. p.r.n. pain.  FOLLOW-UP:  One week our office for staple removal and consideration of removal of retention sutures.  HISTORY OF PRESENT ILLNESS:  This 45 year old female is admitted for a total abdominal hysterectomy, partial panniculectomy, and ventral hernia repair by Dr. Lovell Sheehan.  The patient has been followed through our office for several years with endometrial polyp suspected with hysteroscopy, D&C performed. Birth control pills were not tolerated by discomfort and moodiness and malaise associated with the pill.  She is a smoker so she must stop the pills for that reason.  PAST MEDICAL HISTORY:  Empty sella syndrome not requiring endocrine management, asthma with intermittent bronchitis, with recent upper GI performed ruling out hiatal hernia.  PAST SURGICAL HISTORY:  A 9 mm melanoma removed 1998 with negative sentinel node biopsy, lipoma excision left buttock 2000, D&C and tubal ligation and incisional hernia repair September 2001, laparoscopic cholecystectomy 1998 (source of hernia).  HOSPITAL COURSE:  Patient was admitted through Day Surgery having had a bowel prep the day before.  Ted hose were not able to be fitted due to the patients obesity.  The patient then had a  midline vertical incision performed with wide excision of a large ellipse of skin and underlying fatty tissue to reduce the portion of the panniculus.  The patient tolerated this and subsequent hysterectomy without difficulty.  We were able to access the supraumbilical hernia through the vertical lower abdominal incision and we closed that in a transverse fashion using permanent suture and Smead-Jones suture.  The patient then proceeded to have an uncomplicated postoperative course.  She had a Jackson-Pratt drain in place for 48 hours.  Hemoglobin postoperative was 10.5, hematocrit 30.8 compared to 12.4 and 35.7 respectively preoperatively.  She remained afebrile.  She did not require transfusion.  Patient was stable for discharge on postoperative day #2 with JP drain removed, retention sutures left in place for followup in one week for consideration of removal of both staples and suture. Dictated by:   Christin Bach, M.D. Attending Physician:  Tilda Burrow DD:  11/09/00 TD:  11/10/00 Job: [id number]

## 2010-10-20 LAB — CBC
HCT: 42.2
Hemoglobin: 14.2
Platelets: 202
WBC: 15.2 — ABNORMAL HIGH

## 2010-10-20 LAB — BASIC METABOLIC PANEL
CO2: 24
Calcium: 9.2
Chloride: 106
Creatinine, Ser: 0.93
GFR calc non Af Amer: >60
Glucose, Bld: 102 — ABNORMAL HIGH

## 2011-01-13 ENCOUNTER — Encounter: Payer: Self-pay | Admitting: Cardiology

## 2013-09-04 ENCOUNTER — Other Ambulatory Visit (HOSPITAL_COMMUNITY): Payer: Self-pay | Admitting: Pulmonary Disease

## 2013-09-04 DIAGNOSIS — N838 Other noninflammatory disorders of ovary, fallopian tube and broad ligament: Secondary | ICD-10-CM

## 2013-09-08 ENCOUNTER — Encounter (HOSPITAL_COMMUNITY)
Admission: RE | Admit: 2013-09-08 | Discharge: 2013-09-08 | Disposition: A | Discharge status: Home / Self Care | Payer: PRIVATE HEALTH INSURANCE | Source: Ambulatory Visit | Attending: Diagnostic Radiology | Admitting: Diagnostic Radiology

## 2013-09-08 DIAGNOSIS — N839 Noninflammatory disorder of ovary, fallopian tube and broad ligament, unspecified: Secondary | ICD-10-CM | POA: Diagnosis present

## 2013-09-08 DIAGNOSIS — N838 Other noninflammatory disorders of ovary, fallopian tube and broad ligament: Secondary | ICD-10-CM

## 2013-09-08 DIAGNOSIS — N289 Disorder of kidney and ureter, unspecified: Secondary | ICD-10-CM | POA: Insufficient documentation

## 2013-09-08 DIAGNOSIS — N269 Renal sclerosis, unspecified: Secondary | ICD-10-CM | POA: Insufficient documentation

## 2013-09-08 LAB — GLUCOSE, CAPILLARY: GLUCOSE-CAPILLARY: 89 mg/dL (ref 70–99)

## 2013-09-08 MED ORDER — FLUDEOXYGLUCOSE F - 18 (FDG) INJECTION
10.3000 | Freq: Once | INTRAVENOUS | Status: AC | PRN
Start: 2013-09-08 — End: 2013-09-08

## 2013-11-02 ENCOUNTER — Other Ambulatory Visit: Payer: Self-pay

## 2014-08-28 ENCOUNTER — Other Ambulatory Visit (HOSPITAL_COMMUNITY): Payer: Self-pay | Admitting: Internal Medicine

## 2014-08-28 DIAGNOSIS — C787 Secondary malignant neoplasm of liver and intrahepatic bile duct: Secondary | ICD-10-CM

## 2014-08-28 DIAGNOSIS — D3912 Neoplasm of uncertain behavior of left ovary: Secondary | ICD-10-CM

## 2014-08-29 ENCOUNTER — Ambulatory Visit (HOSPITAL_COMMUNITY)
Admission: RE | Admit: 2014-08-29 | Discharge: 2014-08-29 | Disposition: A | Discharge status: Home / Self Care | Payer: PRIVATE HEALTH INSURANCE | Source: Ambulatory Visit | Attending: Internal Medicine | Admitting: Internal Medicine

## 2014-08-29 DIAGNOSIS — D3502 Benign neoplasm of left adrenal gland: Secondary | ICD-10-CM | POA: Diagnosis not present

## 2014-08-29 DIAGNOSIS — D3912 Neoplasm of uncertain behavior of left ovary: Secondary | ICD-10-CM

## 2014-08-29 DIAGNOSIS — C787 Secondary malignant neoplasm of liver and intrahepatic bile duct: Secondary | ICD-10-CM | POA: Diagnosis not present

## 2014-08-29 DIAGNOSIS — J439 Emphysema, unspecified: Secondary | ICD-10-CM | POA: Insufficient documentation

## 2014-08-29 DIAGNOSIS — R918 Other nonspecific abnormal finding of lung field: Secondary | ICD-10-CM | POA: Insufficient documentation

## 2014-08-29 DIAGNOSIS — K429 Umbilical hernia without obstruction or gangrene: Secondary | ICD-10-CM | POA: Insufficient documentation

## 2014-08-29 DIAGNOSIS — C569 Malignant neoplasm of unspecified ovary: Secondary | ICD-10-CM | POA: Diagnosis not present

## 2014-08-29 LAB — GLUCOSE, CAPILLARY: Glucose-Capillary: 94 mg/dL (ref 65–99)

## 2014-08-29 MED ORDER — FLUDEOXYGLUCOSE F - 18 (FDG) INJECTION
8.9900 | Freq: Once | INTRAVENOUS | Status: DC | PRN
  Administered 2014-08-29: 8.99 via INTRAVENOUS
  Filled 2014-08-29: qty 8.99

## 2014-09-05 ENCOUNTER — Telehealth: Payer: Self-pay | Admitting: *Deleted

## 2014-09-05 ENCOUNTER — Telehealth: Payer: Self-pay | Admitting: Gynecologic Oncology

## 2014-09-05 NOTE — Telephone Encounter (Signed)
Received a call from [place] at Dr. Tressie Stalker office requesting that patient appointment for 09/06/2014 be canceled. Crystal stated " the patient will not be able to come to this appointment". Crystal declined to reschedule at this time.

## 2014-09-05 NOTE — Telephone Encounter (Signed)
Called and spoke with RN.  RN stating patient is going to see Dr. Fermin Schwab at [facility].

## 2014-09-06 ENCOUNTER — Ambulatory Visit: Payer: PRIVATE HEALTH INSURANCE | Admitting: Gynecologic Oncology

## 2015-05-12 ENCOUNTER — Other Ambulatory Visit: Payer: Self-pay | Admitting: Oncology

## 2015-07-17 ENCOUNTER — Telehealth: Payer: Self-pay | Admitting: Oncology

## 2015-07-17 NOTE — Telephone Encounter (Signed)
LT MESS REGARDING NEW PT APPT

## 2015-07-18 ENCOUNTER — Encounter: Payer: Self-pay | Admitting: Oncology

## 2015-07-18 ENCOUNTER — Telehealth: Payer: Self-pay | Admitting: Oncology

## 2015-07-18 NOTE — Telephone Encounter (Signed)
Lt mess regarding new pt referral and mailed out letter

## 2015-07-29 ENCOUNTER — Telehealth: Payer: Self-pay | Admitting: Oncology

## 2015-07-29 NOTE — Telephone Encounter (Signed)
Pt called will be gone on vacation during appt time. Will call back to schedule when she returns

## 2015-08-04 ENCOUNTER — Encounter: Payer: Self-pay | Admitting: Oncology

## 2015-08-04 ENCOUNTER — Other Ambulatory Visit: Payer: PRIVATE HEALTH INSURANCE

## 2015-08-04 ENCOUNTER — Ambulatory Visit: Payer: PRIVATE HEALTH INSURANCE | Admitting: Oncology

## 2015-08-04 ENCOUNTER — Other Ambulatory Visit: Payer: Self-pay | Admitting: Oncology

## 2015-08-04 ENCOUNTER — Telehealth: Payer: Self-pay | Admitting: Oncology

## 2015-08-04 DIAGNOSIS — C562 Malignant neoplasm of left ovary: Secondary | ICD-10-CM

## 2015-08-04 NOTE — Progress Notes (Signed)
MEDICAL ONCOLOGY  Scheduling messages copied below:  Nikki: Fine to schedule new patient appointment using some other open new patient spot in August  Fine to schedule PAC flush this week if she is due. OK to draw labs from [facility] with flush.  I put in POF for the flush + lab orders now.  I did not include the new patient apt  Does this work? Thank you Pilot Prindle   ===View-only below this line===  ----- Message -----    From: Luiz Ochoa    Sent: 08/04/2015   9:14 AM      To: Gordy Levan, MD Subject: New Patient Referral reschedule                Dr. Marko Plume,  Ms. Kofoed is out of town on vacation during the appt time you gave can you see her during the week of July 31, she is past due for a port flush.  Thanks [employer]

## 2015-08-04 NOTE — Telephone Encounter (Signed)
Pt confirmed appts (flush, lab, new pt); completed intake, reviewed information changes in EPIC

## 2015-08-05 ENCOUNTER — Encounter: Payer: Self-pay | Admitting: Oncology

## 2015-08-12 ENCOUNTER — Ambulatory Visit: Payer: PRIVATE HEALTH INSURANCE

## 2015-08-12 ENCOUNTER — Other Ambulatory Visit (HOSPITAL_BASED_OUTPATIENT_CLINIC_OR_DEPARTMENT_OTHER): Payer: PRIVATE HEALTH INSURANCE

## 2015-08-12 DIAGNOSIS — C562 Malignant neoplasm of left ovary: Secondary | ICD-10-CM | POA: Diagnosis not present

## 2015-08-12 DIAGNOSIS — Z95828 Presence of other vascular implants and grafts: Secondary | ICD-10-CM

## 2015-08-12 LAB — CBC WITH DIFFERENTIAL/PLATELET
BASO%: 0.3 % (ref 0.0–2.0)
Basophils Absolute: 0 10*3/uL (ref 0.0–0.1)
EOS ABS: 0.2 10*3/uL (ref 0.0–0.5)
EOS%: 1.8 % (ref 0.0–7.0)
HCT: 34.9 % (ref 34.8–46.6)
HGB: 11.4 g/dL — ABNORMAL LOW (ref 11.6–15.9)
LYMPH#: 2.5 10*3/uL (ref 0.9–3.3)
LYMPH%: 23.8 % (ref 14.0–49.7)
MCH: 28.9 pg (ref 25.1–34.0)
MCHC: 32.7 g/dL (ref 31.5–36.0)
MCV: 88.4 fL (ref 79.5–101.0)
MONO#: 0.4 10*3/uL (ref 0.1–0.9)
MONO%: 4 % (ref 0.0–14.0)
NEUT#: 7.3 10*3/uL — ABNORMAL HIGH (ref 1.5–6.5)
NEUT%: 70.1 % (ref 38.4–76.8)
Platelets: 188 10*3/uL (ref 145–400)
RBC: 3.95 10*6/uL (ref 3.70–5.45)
RDW: 17.3 % — AB (ref 11.2–14.5)
WBC: 10.5 10*3/uL — ABNORMAL HIGH (ref 3.9–10.3)
nRBC: 0 % (ref 0–0)

## 2015-08-12 LAB — COMPREHENSIVE METABOLIC PANEL
ALT: 12 U/L (ref 0–55)
AST: 12 U/L (ref 5–34)
Albumin: 3.4 g/dL — ABNORMAL LOW (ref 3.5–5.0)
Alkaline Phosphatase: 88 U/L (ref 40–150)
Anion Gap: 9 mEq/L (ref 3–11)
BUN: 18.5 mg/dL (ref 7.0–26.0)
CO2: 20 meq/L — AB (ref 22–29)
CREATININE: 1.4 mg/dL — AB (ref 0.6–1.1)
Calcium: 8.6 mg/dL (ref 8.4–10.4)
Chloride: 113 mEq/L — ABNORMAL HIGH (ref 98–109)
EGFR: 44 mL/min/{1.73_m2} — ABNORMAL LOW (ref >90)
GLUCOSE: 106 mg/dL (ref 70–140)
Potassium: 3.8 mEq/L (ref 3.5–5.1)
Sodium: 141 mEq/L (ref 136–145)
TOTAL PROTEIN: 6.2 g/dL — AB (ref 6.4–8.3)
Total Bilirubin: 0.44 mg/dL (ref 0.20–1.20)

## 2015-08-12 MED ORDER — HEPARIN SOD (PORK) LOCK FLUSH 100 UNIT/ML IV SOLN
500.0000 [IU] | Freq: Once | INTRAVENOUS | Status: AC | PRN
  Administered 2015-08-12: 500 [IU] via INTRAVENOUS
  Filled 2015-08-12: qty 5

## 2015-08-12 MED ORDER — SODIUM CHLORIDE 0.9 % IJ SOLN
10.0000 mL | INTRAMUSCULAR | Status: DC | PRN
  Administered 2015-08-12: 10 mL via INTRAVENOUS
  Filled 2015-08-12: qty 10

## 2015-08-14 LAB — INHIBIN A: INHIBIN A: 1.9 pg/mL

## 2015-08-14 LAB — INHIBIN B: Inhibin B: 10.1 pg/mL

## 2015-08-18 ENCOUNTER — Telehealth: Payer: Self-pay | Admitting: Oncology

## 2015-08-18 NOTE — Telephone Encounter (Signed)
Pt called in requesting information to log into my chart. I noted it in her appointment that she needs the printout to set up my chart.

## 2015-08-25 ENCOUNTER — Telehealth: Payer: Self-pay | Admitting: Oncology

## 2015-08-25 ENCOUNTER — Ambulatory Visit (HOSPITAL_BASED_OUTPATIENT_CLINIC_OR_DEPARTMENT_OTHER): Payer: PRIVATE HEALTH INSURANCE | Admitting: Oncology

## 2015-08-25 ENCOUNTER — Encounter: Payer: Self-pay | Admitting: Oncology

## 2015-08-25 VITALS — BP 155/78 | HR 56 | Temp 98.5°F | Resp 18 | Wt 208.6 lb

## 2015-08-25 DIAGNOSIS — C562 Malignant neoplasm of left ovary: Secondary | ICD-10-CM | POA: Diagnosis not present

## 2015-08-25 DIAGNOSIS — I10 Essential (primary) hypertension: Secondary | ICD-10-CM

## 2015-08-25 DIAGNOSIS — F329 Major depressive disorder, single episode, unspecified: Secondary | ICD-10-CM

## 2015-08-25 DIAGNOSIS — C787 Secondary malignant neoplasm of liver and intrahepatic bile duct: Secondary | ICD-10-CM | POA: Diagnosis not present

## 2015-08-25 DIAGNOSIS — G620 Drug-induced polyneuropathy: Secondary | ICD-10-CM | POA: Diagnosis not present

## 2015-08-25 DIAGNOSIS — T451X5A Adverse effect of antineoplastic and immunosuppressive drugs, initial encounter: Secondary | ICD-10-CM

## 2015-08-25 DIAGNOSIS — M5030 Other cervical disc degeneration, unspecified cervical region: Secondary | ICD-10-CM

## 2015-08-25 DIAGNOSIS — E669 Obesity, unspecified: Secondary | ICD-10-CM

## 2015-08-25 DIAGNOSIS — Z95828 Presence of other vascular implants and grafts: Secondary | ICD-10-CM

## 2015-08-25 DIAGNOSIS — Z720 Tobacco use: Secondary | ICD-10-CM

## 2015-08-25 DIAGNOSIS — F17209 Nicotine dependence, unspecified, with unspecified nicotine-induced disorders: Secondary | ICD-10-CM

## 2015-08-25 DIAGNOSIS — N183 Chronic kidney disease, stage 3 unspecified: Secondary | ICD-10-CM

## 2015-08-25 DIAGNOSIS — I700 Atherosclerosis of aorta: Secondary | ICD-10-CM

## 2015-08-25 NOTE — Progress Notes (Signed)
[place] NEW PATIENT EVALUATION   Name: Felicia Wallace Date: August 25, 2015  MRN: [id number] DOB: 04/18/1965  REFERRING PHYSICIAN: Everardo All  cc Sinda Du, MD, [facility] Sessions, ([place])    REASON FOR REFERRAL: transfer medical oncology care for metastatic granulosa cell tumor of ovary  Outside records from [facility] reviewed for this consultation, and will be scanned into this EMR.   HISTORY OF PRESENT ILLNESS:Felicia Wallace is a 50 y.o. female who is seen in consultation, together with husband , at the request of Dr Everardo All, for transfer of medical oncology care to [facility] due to closure of [facility]. 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  Patient initially noticed unintentional weight loss without other symptoms, 60 lbs over ~ a year (from 260 to 200 lbs). With that weight loss, she noticed a mass in LLQ. She  was diagnosed with granulosa cell tumor of left ovary at [facility] and lymph node sampling 10-23-2013, primary 18.3 cm and high grade features reported. She received carboplatin taxol x 3 cycles from 12-05-13 thru 12-20-13. CT AP 02-11-14 showed no persistent disease, with normal CA 125 and inhibin levels. CT 08-26-14 showed multiple liver metastases confirmed by PET. Liver core needle biopsy at [facility] 09-25-14 confirmed recurrent granulosa cell tumor ([id number]), identical to initial diagnostic pathology, immunostains confirmed and ER + 90%, PR+ 1005 . She was treated with BEP from 10-07-14 thru 03-14-15 (bleo discontinued 11-04-14 due to mucositis and skin reaction. Treatment was changed to tamoxifen alternating with megace every 2 weeks starting 04-07-15.  Inhibin B has been used as marker. 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 Feet "like standing in snow" extending to lower legs bilaterally, hands numb. Warm slippers help, and gabapentin 600 mg bid helps. Mild HA last few weeks "top of head" , may be related to neck. Wears glasses. Decreased hearing prior to start of chemo which she relates to exposure to MRI noise, some environmental allergies Appetite fine. Weight has been stable within 4-8 lbs in past number of months. Occasional GERD for which she uses OTC zantac. Constipation some better with lactulose and stool softener. Voids ok, on bicarb for medication related acidosis (?) PAC by IR No recent change in pain medication for neck primarily, duragesic 50 mcg not tolerated, does tolerate 25 mcg and Dr Luan Pulling considering adding 12.5 to the 25 patch.  No fevers, no symptoms of infection. Voids ok + hot flashes at hs Some exertional SOB Swelling in feet recently, no calf pain, seems to be improving Extremely debilitated after chemo completed, only just now feels able to do limited activities at home.  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 "I  sit at home and smoke"  Remainder of full 10 point review of systems negative.   ALLERGIES: Cefazolin; Penicillins; Bleomycin; Morphine; and Other  PAST MEDICAL/ SURGICAL HISTORY:   G0  C3-4  Disc surgery by  Dr Ellene Route 11-2007 [facility] for menorrhagia, partial paniculectomy and ventral hernia repair by Drs Mallory Shirk, Aviva Signs at [facility] 11-2000 Arthroscopic right knee Dr Arther Abbott 08-2003 Followed at [facility] every 6 months with nonfunctioning left kidney, next visit Nov 2017 HTN followed by PCP Depression years ago and recurrent since cancer diagnosis Long and ongoing tobacco Last mammograms in this EMR 10-2007 at [facility], tho may have had these ~ 3 years ago also at [place]: reviewed as listed in EMR. Tamoxifen dosed at 10 mg bid and megace 40 mg bid, presently on megace since 08-18-15. . Duragesic, gabapentin, hydrocodone. Zoloft.    SOCIAL HISTORY:  From [place], lives in [place] with husband of 9 years. 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 RN, previously worked at [employer] and [employer], unable to work for past year.  Insurance thru [employer]; does not have dental coverage, husband does not have health insurance Information on advance directives given today   FAMILY HISTORY:  Sister died CKD Sister and brother living  No cancer     PHYSICAL EXAM:  weight is 208 lb 9.6 oz (94.6 kg). Her oral temperature is 98.5 F (36.9 C). Her blood pressure is 155/78 (abnormal) and her pulse is 56 (abnormal). Her respiration is 18 and oxygen saturation is 100%.  Alert, pleasant, cooperative lady, looks older than stated age, obese. Excellent historian.   HEENT: Normal hair pattern. PERRL, not icteric. Poor dentition, oral mucosa clear and moist. No JVD. No thyroid mass. Hearing good in exam room   RESPIRATORY: respirations not labored RA. Lungs with somewhat diminished BS, no wheezes or rales.   CARDIAC/ VASCULAR: Heart RRR no gallop, clear heart sounds. Peripheral pulses symmetric.  ABDOMEN: obese, soft, ventral incision closed, midline hernia noted not tender. Cannot appreciate HSM. Some bowel sounds. No fluid wave. nontender.  LYMPH NODES: no  cervical, supraclavicular, axillary or inguinal adenopathy  BREASTS: bilaterally without dominant mass, skin or nipple findings of concern  NEUROLOGIC: peripheral neuropathy hands and feet to lower 1/3 legs. CN, motor, cerebellar nonfocal. PSYCH: tearful at times during exam, but good eye contact and overall not markedly depressed affect  SKIN: without rash, ecchymosis, petechiae  MUSCULOSKELETAL: LE trace pedal edema bilaterally without cords or tenderness. No swelling UE. Muscle mass symmetrical  Portacath without tenderness or erythema   LABORATORY DATA:  Labs drawn with PAC flush 08-12-15  CBC: BC 10.5, ANC 7.3, Hgb 11.4, plt 188k CMET ith Na 141, K 3.8, Cl 113, CO2 20, glu 106, BUN 18.5, creat 1.4, Tbili 0.44, AP 88, AST 12, ALT 12, prot 6.2, alb 3.4, Ca 8.6, EGFR 44 Inhibin B 10.1    NOTE inhibin B level not legible on faxed labs from [place], but was higher at last check per patient. Inhibin A  1.9  PATHOLOGY: Report Surgical pathology exam9/07/2014 [facility] Care Component Name Value Ref Range  Case Report Surgical Pathology Report Case: [id number]  Authorizing Provider:Hyeon Tasia Catchings, MD Collected: 09/25/2014 1615 [facility]2014 [place]  Pathologist: Nicholes Stairs, MD  Specimen:Liver   Addendum This addendum is issued to report results of hormone receptor immunohistochemistry.  Results:  Estrogen receptor (Ventana, clone SP1):Positive 90%, 2+  Progesterone receptor (Ventana, clone  1E2):Positive 100%, 3+  Site:Liver Performed on block:A1   Final Diagnosis  A: Liver, core biopsy  - Metastatic granulosa cell tumor (see comment)   Comment Immunostains are performed on the biopsy and support the diagnosis. The tumor cells are diffusely positive for vimentin and focally positive for inhibin. A cytokeratin AE1/AE3 immunostain is positive in only rare cells. The tumor in this biopsy is also compared to the patient's outside pelvic mass resection [facility] Outside Consult Case [phone]) and shows identical morphologic features.      RADIOGRAPHY: CT CAP [facility] system 02-21-15 to be scanned into this EMR, summarized here: With contrast. Compared with 12-06-14 Chest: no supraclavicular adenopathy. Right PAC into high right atrium. Left axillary node dissection. Aberrant right subclavian artery. Normal heart size without pericardial effusion. No mediastinal or hilar adenopathy. No suspicious pulmonary nodule.  No acute osseous abnormality AP:  Multiple hypo attenuating liver lesions. Hepatic dome 9 x 6 mm decreased from 10 x 8 mm. Segment 4 lesion diminished to resolved. Posterior right hepatic lobe lesion equivocally present at 7 mm compared with 16 x 12 mm prior. Pancreas and spleen normal. Left adrenal adenoma similar. Moderate left renal atrophy. Normal right kidney without hydro. Normal GI except constipation. Age advanced aortic and branch vessel atherosclerosis. Small retroperitoneal nodes similar. Hysterectomy. No adnexal mass. Fat containing abdominal wall hernias. No ascites. No omental or peritoneal disease.     DISCUSSION: All of above history reviewed with patient and husband. I have told her that granulosa cell tumors can be indolent even when metastatic, and that present hormonal therapy seems very reasonable.  Patient is relieved with reported improvement in inhibin B level. We will repeat this with next labs prior to deciding if she needs repeat scans now, or if ok to wait longer as we follow. Smoking cessation encouraged I have notified financial  counselors, as they may be helpful to her with insurance concerns.  Message to [facility] support staff I will try to coordinate MD visits with PAC flushes, tho she knows that she can call prior to scheduled visit if needed.     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 BEP 10-07-14 thru 03-14-15, tolerated poorly and bleo held. Now on alternating tamoxifen megace every 2 weeks since 04-07-15, with improvement in inhibin B marker (liver mets strongly ER PR +).  I will see her back ~ 10-06-15 with repeat labs. 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  3.PAC in, needs flush every 6-8 weeks when not otherwise used 4.degenerative arthritis and disc disease, previous cervical spine surgery 5.heavy ongoing tobacco abuse: counseled 6.obesity: likely worsening PS and arthritis symptoms. 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 Discussed. Offered Development worker, community and I will contact [facility] support staff 9.CKD 3, single functioning kidney: followed at [facility] 10. HTN followed by PCP 11. Overdue mammograms, last at [facility]. Will look into mammogram scholarship     Patient and husband have had questions answered to their satisfaction and are in agreement with plan above. They can contact this office for questions or concerns at any time prior to next scheduled visit. Cc Drs  Hawkins, Roma Time spent 60 min, including >50% discussion and coordination of care.    Evlyn Clines, MD 08/25/2015 12:39 PM

## 2015-08-31 ENCOUNTER — Other Ambulatory Visit: Payer: Self-pay | Admitting: Oncology

## 2015-08-31 DIAGNOSIS — C787 Secondary malignant neoplasm of liver and intrahepatic bile duct: Secondary | ICD-10-CM | POA: Insufficient documentation

## 2015-08-31 DIAGNOSIS — C562 Malignant neoplasm of left ovary: Secondary | ICD-10-CM

## 2015-08-31 DIAGNOSIS — N183 Chronic kidney disease, stage 3 unspecified: Secondary | ICD-10-CM | POA: Insufficient documentation

## 2015-08-31 DIAGNOSIS — G620 Drug-induced polyneuropathy: Secondary | ICD-10-CM | POA: Insufficient documentation

## 2015-08-31 DIAGNOSIS — T451X5A Adverse effect of antineoplastic and immunosuppressive drugs, initial encounter: Secondary | ICD-10-CM | POA: Insufficient documentation

## 2015-08-31 DIAGNOSIS — F17209 Nicotine dependence, unspecified, with unspecified nicotine-induced disorders: Secondary | ICD-10-CM | POA: Insufficient documentation

## 2015-08-31 DIAGNOSIS — M5030 Other cervical disc degeneration, unspecified cervical region: Secondary | ICD-10-CM | POA: Insufficient documentation

## 2015-08-31 DIAGNOSIS — E669 Obesity, unspecified: Secondary | ICD-10-CM | POA: Insufficient documentation

## 2015-08-31 DIAGNOSIS — I700 Atherosclerosis of aorta: Secondary | ICD-10-CM | POA: Insufficient documentation

## 2015-09-01 ENCOUNTER — Encounter: Payer: Self-pay | Admitting: Oncology

## 2015-09-01 NOTE — Progress Notes (Signed)
Rcvd staff msg from Dr. Marko Plume stating pt had financial concerns so I called her to inquire and to offer assistance if possible.  Pt stated she needed assistance w/ hospital bills from [facility] as well as other outside organizations.  After looking at her physician billing she doesn't have anything outstanding so I informed her unfortunately at this time there wasn't anything I can help her with but advised she call the outside organizations to inquire about assistance as well as [facility]'s billing department for assistance.  She stated she's already set up on a payment plan with [facility].  That being the case there isn't anything else the hospital can offer her.  She verbalized understanding.

## 2015-10-05 ENCOUNTER — Other Ambulatory Visit: Payer: Self-pay | Admitting: Oncology

## 2015-10-06 ENCOUNTER — Ambulatory Visit (HOSPITAL_BASED_OUTPATIENT_CLINIC_OR_DEPARTMENT_OTHER): Payer: PRIVATE HEALTH INSURANCE | Admitting: Oncology

## 2015-10-06 ENCOUNTER — Ambulatory Visit: Payer: PRIVATE HEALTH INSURANCE

## 2015-10-06 ENCOUNTER — Encounter: Payer: Self-pay | Admitting: Oncology

## 2015-10-06 ENCOUNTER — Ambulatory Visit (HOSPITAL_BASED_OUTPATIENT_CLINIC_OR_DEPARTMENT_OTHER): Payer: PRIVATE HEALTH INSURANCE

## 2015-10-06 VITALS — BP 160/91 | HR 77 | Temp 98.9°F | Resp 18 | Ht 64.0 in | Wt 210.5 lb

## 2015-10-06 DIAGNOSIS — T451X5A Adverse effect of antineoplastic and immunosuppressive drugs, initial encounter: Secondary | ICD-10-CM

## 2015-10-06 DIAGNOSIS — Z95828 Presence of other vascular implants and grafts: Secondary | ICD-10-CM

## 2015-10-06 DIAGNOSIS — C787 Secondary malignant neoplasm of liver and intrahepatic bile duct: Secondary | ICD-10-CM

## 2015-10-06 DIAGNOSIS — E669 Obesity, unspecified: Secondary | ICD-10-CM

## 2015-10-06 DIAGNOSIS — N183 Chronic kidney disease, stage 3 unspecified: Secondary | ICD-10-CM

## 2015-10-06 DIAGNOSIS — I10 Essential (primary) hypertension: Secondary | ICD-10-CM

## 2015-10-06 DIAGNOSIS — G622 Polyneuropathy due to other toxic agents: Secondary | ICD-10-CM | POA: Diagnosis not present

## 2015-10-06 DIAGNOSIS — C562 Malignant neoplasm of left ovary: Secondary | ICD-10-CM

## 2015-10-06 DIAGNOSIS — F329 Major depressive disorder, single episode, unspecified: Secondary | ICD-10-CM

## 2015-10-06 DIAGNOSIS — R0609 Other forms of dyspnea: Secondary | ICD-10-CM

## 2015-10-06 DIAGNOSIS — Z720 Tobacco use: Secondary | ICD-10-CM

## 2015-10-06 DIAGNOSIS — F17209 Nicotine dependence, unspecified, with unspecified nicotine-induced disorders: Secondary | ICD-10-CM

## 2015-10-06 DIAGNOSIS — G620 Drug-induced polyneuropathy: Secondary | ICD-10-CM

## 2015-10-06 DIAGNOSIS — K439 Ventral hernia without obstruction or gangrene: Secondary | ICD-10-CM

## 2015-10-06 LAB — CBC WITH DIFFERENTIAL/PLATELET
BASO%: 0.7 % (ref 0.0–2.0)
Basophils Absolute: 0.1 10*3/uL (ref 0.0–0.1)
EOS%: 1.5 % (ref 0.0–7.0)
Eosinophils Absolute: 0.2 10*3/uL (ref 0.0–0.5)
HCT: 36.3 % (ref 34.8–46.6)
HGB: 11.8 g/dL (ref 11.6–15.9)
LYMPH#: 1.9 10*3/uL (ref 0.9–3.3)
LYMPH%: 14.8 % (ref 14.0–49.7)
MCH: 29.2 pg (ref 25.1–34.0)
MCHC: 32.5 g/dL (ref 31.5–36.0)
MCV: 89.7 fL (ref 79.5–101.0)
MONO#: 0.7 10*3/uL (ref 0.1–0.9)
MONO%: 5.5 % (ref 0.0–14.0)
NEUT#: 9.9 10*3/uL — ABNORMAL HIGH (ref 1.5–6.5)
NEUT%: 77.5 % — AB (ref 38.4–76.8)
Platelets: 200 10*3/uL (ref 145–400)
RBC: 4.05 10*6/uL (ref 3.70–5.45)
RDW: 17.5 % — AB (ref 11.2–14.5)
WBC: 12.8 10*3/uL — ABNORMAL HIGH (ref 3.9–10.3)

## 2015-10-06 LAB — COMPREHENSIVE METABOLIC PANEL
ANION GAP: 8 meq/L (ref 3–11)
AST: 10 U/L (ref 5–34)
Albumin: 3.4 g/dL — ABNORMAL LOW (ref 3.5–5.0)
Alkaline Phosphatase: 97 U/L (ref 40–150)
BUN: 30.4 mg/dL — AB (ref 7.0–26.0)
CHLORIDE: 115 meq/L — AB (ref 98–109)
CO2: 17 meq/L — AB (ref 22–29)
CREATININE: 1.4 mg/dL — AB (ref 0.6–1.1)
Calcium: 8.7 mg/dL (ref 8.4–10.4)
EGFR: 43 mL/min/{1.73_m2} — ABNORMAL LOW (ref >90)
Glucose: 105 mg/dl (ref 70–140)
Potassium: 3.9 mEq/L (ref 3.5–5.1)
SODIUM: 140 meq/L (ref 136–145)
Total Bilirubin: <0.3 mg/dL (ref 0.20–1.20)
Total Protein: 6.4 g/dL (ref 6.4–8.3)

## 2015-10-06 MED ORDER — SODIUM CHLORIDE 0.9 % IJ SOLN
10.0000 mL | INTRAMUSCULAR | Status: DC | PRN
  Administered 2015-10-06: 10 mL via INTRAVENOUS
  Filled 2015-10-06: qty 10

## 2015-10-06 MED ORDER — HEPARIN SOD (PORK) LOCK FLUSH 100 UNIT/ML IV SOLN
500.0000 [IU] | Freq: Once | INTRAVENOUS | Status: AC | PRN
  Administered 2015-10-06: 500 [IU] via INTRAVENOUS
  Filled 2015-10-06: qty 5

## 2015-10-06 NOTE — Progress Notes (Signed)
OFFICE PROGRESS NOTE   October 06, 2015   Physicians: Sinda Du, MD, [facility] Beverly Sessions, ([place]) Everardo All) New patient referral to Dr Karlyn Agee (surgery [facility])  INTERVAL HISTORY:  Patient is seen, together with mother, in continuing attention to metastatic granulosa cell tumor of ovary involving liver, on alternating tamoxifen and megace since 04-07-15. Last imaging was CT CAP in [facility] system 02-21-15; inhibin B marker has been useful.  Situation is complicated by large ventral hernia, ongoing tobacco, chemo related peripheral neuropathy She is to see PCP Dr Luan Pulling on 10-09-15  Patient has been tolerating more activity in last few weeks, including yard work, tho she fell x4 with the yard activities due to neuropathy in feet., no significant injuries.  She continues to smoke, discussed. She would like to have the abdominal hernia repaired if possible, no acute pain and area reducible, but constantly noticeable including some increased low back pain associated. She is more comfortable with support garments for the ventral hernia. Her insurance is best at [facility], requests referral to Dr Karlyn Agee for surgical consultation. I have mentioned that surgeon may prefer some weight loss prior to hernia repair, and certainly would prefer off cigarettes.  Patient notices increased SOB with exertion, tho she is also more active now than over last several months. She does not have associated chest pain, no increased cough, occasional wheezing and does improve with prn inhaler.  She has decreased pain medication for arthritis, now using norco just once in AM, this by PCP. Appetite good with megace and she is trying to watch po's, no fever or symptoms of infection, no new or different abdominal or pelvic discomfort, no increased swelling L. No problems with PAC. Remainder of 10 point Review of Systems negative.    PAC flushed 10-06-15 Needs flu  vaccine this fall   ONCOLOGIC HISTORY Patient initially noticed unintentional weight loss without other symptoms, 60 lbs over ~ a year (from 260 to 200 lbs). With that weight loss, she noticed a mass in LLQ. She  was diagnosed with granulosa cell tumor of left ovary at [facility] and lymph node sampling 10-23-2013, primary 18.3 cm and high grade features reported. She received carboplatin taxol x 3 cycles from 12-05-13 thru 12-20-13. CT AP 02-11-14 showed no persistent disease, with normal CA 125 and inhibin levels. CT 08-26-14 showed multiple liver metastases confirmed by PET. Liver core needle biopsy at [facility] 09-25-14 confirmed recurrent granulosa cell tumor ([id number]), identical to initial diagnostic pathology, immunostains confirmed and ER + 90%, PR+ 1005 . She was treated with BEP from 10-07-14 thru 03-14-15 (bleo discontinued 11-04-14 due to mucositis and skin reaction. Treatment was changed to tamoxifen alternating with megace every 2 weeks starting 04-07-15.  Inhibin B has been used as marker. 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   Objective:  Vital signs in last 24 hours:  BP (!) 160/91 (BP Location: Right Arm, Patient Position: Sitting)   Pulse 77   Temp 98.9 F (37.2 C) (Oral)   Resp 18   Ht '5\' 4"'$  (1.626 m)   Wt  210 lb 8 oz (95.5 kg)   SpO2 100%   BMI 36.13 kg/m  Weight up 2 lbs. Talkative, affect better, respirations not labored with exertion in exam room, does not appear acutely uncomfortable. Alert, oriented and  appropriate. Ambulatory slowly without assistance. No alopecia  HEENT:PERRL, sclerae not icteric. Oral mucosa moist without lesions, posterior pharynx clear.  Neck supple. No JVD.  Lymphatics:no supraclavicular adenopathy Resp: clear to auscultation bilaterally and no dullness to percussion bilaterally Cardio: regular rate and rhythm. No gallop. GI: abdomen obese, soft, nontender, not distended, no appreciable mass or organomegaly. Large ventral hernia, soft, not tender. Normally active bowel sounds.  Musculoskeletal/ Extremities: LE without pitting edema, cords, tenderness Neuro:  peripheral neuropathy feet and lower legs bilaterally. Otherwise nonfocal. PSYCH appropriate mood and affect Skin without rash, ecchymosis, petechiae Portacath-without erythema or tenderness  Lab Results:  Results for orders placed or performed in visit on 10/06/15  CBC with Differential  Result Value Ref Range   WBC 12.8 (H) 3.9 - 10.3 10e3/uL   NEUT# 9.9 (H) 1.5 - 6.5 10e3/uL   HGB 11.8 11.6 - 15.9 g/dL   HCT 36.3 34.8 - 46.6 %   Platelets 200 145 - 400 10e3/uL   MCV 89.7 79.5 - 101.0 fL   MCH 29.2 25.1 - 34.0 pg   MCHC 32.5 31.5 - 36.0 g/dL   RBC 4.05 3.70 - 5.45 10e6/uL   RDW 17.5 (H) 11.2 - 14.5 %   lymph# 1.9 0.9 - 3.3 10e3/uL   MONO# 0.7 0.1 - 0.9 10e3/uL   Eosinophils Absolute 0.2 0.0 - 0.5 10e3/uL   Basophils Absolute 0.1 0.0 - 0.1 10e3/uL   NEUT% 77.5 (H) 38.4 - 76.8 %   LYMPH% 14.8 14.0 - 49.7 %   MONO% 5.5 0.0 - 14.0 %   EOS% 1.5 0.0 - 7.0 %   BASO% 0.7 0.0 - 2.0 %  Comprehensive metabolic panel  Result Value Ref Range   Sodium 140 136 - 145 mEq/L   Potassium 3.9 3.5 - 5.1 mEq/L   Chloride 115 (H) 98 - 109 mEq/L   CO2 17 (L) 22 - 29 mEq/L   Glucose 105 70 - 140 mg/dl   BUN 30.4 (H) 7.0 - 26.0 mg/dL   Creatinine 1.4 (H) 0.6 - 1.1 mg/dL   Total Bilirubin <0.30 0.20 - 1.20 mg/dL   Alkaline Phosphatase 97 40 - 150 U/L   AST 10 5 - 34 U/L   ALT <9 0 - 55 U/L   Total Protein 6.4 6.4  - 8.3 g/dL   Albumin 3.4 (L) 3.5 - 5.0 g/dL   Calcium 8.7 8.4 - 10.4 mg/dL   Anion Gap 8 3 - 11 mEq/L   EGFR 43 (L) >90 ml/min/1.73 m2    Inibin B level pending  Studies/Results:  Following visit,  imaging discussed with radiologist, given renal insufficiency and single functioning kidney. CT with IV contrast vs MRI best for evaluating liver mets, with MRI contrast possibly better tolerated with renal dysfunction.  NOTE patient's insurance coverage best at [facility].  Medications: I have reviewed the patient's current medications. She continues gabapentin for peripheral neuropathy, but has cut back on hydrocodone to only 1 tablet in AMs for arthritis. Continue tamoxifen megace alternating every 2 weeks.   DISCUSSION Patient seems to be progressively feeling some better and some improvement in performance status as she gets out further from chemotherapy, and is tolerating alternating tamoxifen and megace well. She would like to repeat scans to see  if repair of ventral hernia reasonable to consider. She has not had surgical consultation, but would like referral to Dr Karlyn Agee in [place]. She prefers scans at [facility] - see above.   Will let patient know considerations of CT vs MRI, need to find out if MRI can be done at [facility] and if her copay is manageable.  Increased SOB with exertion may be with increased exertion and deconditioning. She is to see Dr Luan Pulling later this week and will talk with him also, fine to use inhaler in interim.  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 BEP 10-07-14 thru 03-14-15, tolerated poorly and bleo held. Now on alternating tamoxifen megace every 2 weeks since 04-07-15, with improvement in inhibin B marker in 07-2015  (liver mets strongly ER PR +), repeat pending now. Tolerating the hormonal therapy well, and  clinically looks better than when I met her in 07-2015. Will try to get restaging CT AP with contrast vs MRI , see above. I will see her back late Oct  2.significant peripheral neuropathy stocking glove distribution since chemo, on gabapentin. Falls related to neuropathy, without injury, with recent yardwork 3.PAC in, needs flush every 6-8 weeks when not otherwise used 4.degenerative arthritis and disc disease, previous cervical spine surgery 5.heavy ongoing tobacco abuse: counseled. She seems more open to addressing, at least to try to eliminate habit cigarettes now. 6.obesity: likely worsening PS and arthritis symptoms. 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  9.CKD 3, single functioning kidney: followed at [facility] 10. HTN followed by PCP 11. Overdue mammograms, last at [facility]. Message sent now to [facility] ? Scholarship 12.needs flu vaccine this fall 13. Large ventral hernia: no obstruction. Encouraged her to use support garments. Referral to general surgery to consider repair. Recommend repeat scans prior to surgery to be sure situation with the metastatic granulosa cell tumor is appropriate for this surgery  All questions answered and she is in agreement with recommendations and plans. Time spent 25 min including >50% counseling and coordination of care. CC Drs Luan Pulling and Luana Shu   Evlyn Clines, MD   10/06/2015, 6:33 PM

## 2015-10-08 ENCOUNTER — Encounter: Payer: Self-pay | Admitting: General Practice

## 2015-10-08 ENCOUNTER — Telehealth: Payer: Self-pay | Admitting: Oncology

## 2015-10-08 DIAGNOSIS — K439 Ventral hernia without obstruction or gangrene: Secondary | ICD-10-CM | POA: Insufficient documentation

## 2015-10-08 NOTE — Telephone Encounter (Signed)
FAXED PT [facility] [phone]

## 2015-10-08 NOTE — Progress Notes (Signed)
[place] Spiritual Care Note   Reached Felicia Wallace by phone to offer Spiritual Care check-in. She reports that she's doing much better now that she's physically able to get out of the house, participating in meaning-making activities like gardening and going out to lunch with her old friends from work.  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  I reinforced that this self-awareness is a powerful part of her healing.  At this time she is in good spirits and reports no other needs.  Reminded her of ongoing Support Team availability and encouraged her to reach out anytime.     [place], [place], [facility] Pager [phone] Voicemail [phone]

## 2015-10-09 LAB — INHIBIN B: Inhibin B: 37.5 pg/mL

## 2015-10-10 ENCOUNTER — Telehealth: Payer: Self-pay

## 2015-10-10 NOTE — Telephone Encounter (Signed)
lvm that inhibin B is 37. S/w Anson Crofts in [place] [facility] to get results of prior inhibin B. She did see 2 in chart. She will fax results for last 6 months.

## 2015-10-10 NOTE — Telephone Encounter (Signed)
-----   Message from Gordy Levan, MD sent at 10/10/2015  9:28 AM EDT ----- Labs seen and need follow up: please let her know inhibin B was 37 this week. The inhibin B results in other patients have been variable at times, and we are already planning imaging upcoming, so that is also appropriate.  RN: I cannot see the results of inhibin B from [facility] in [place] (06-2015 and priors) and the faxed labs at her transfer were not legible for inhibin B. Please see if [facility] hospital has access to those results, done at [place] [facility] under [place].  thanks

## 2015-10-13 ENCOUNTER — Telehealth: Payer: Self-pay

## 2015-10-13 NOTE — Telephone Encounter (Addendum)
Ms Felicia Wallace states that her insurance said that an MRI can be done in the [place]. It is in her network. Told Ms Felicia Wallace that a note will be sent to Dr. Marko Plume with this information so an order can be placed.    Ms. Felicia Wallace saw surgeon Dr.  Karlyn Agee today.   He said that it was ok to do the surgery for the hernia.  He would need to put in a mesh. The surgery would be fairly extensive.  He would like to wait until MRI done since her labs are fluctuating as she may need other surgery.  Ms Felicia Wallace looked up her last Inhibin B Levels on My Chart for the last six months.  She stated that she will go to [facility] and get paper copies and bring to her next appointment with Dr. Marko Plume.  Feb 10, 2015  Inhibin B = < 7 Feb. 20, 2017 Inhibin B = < 7 March 20,2017 Inhibin B = 8.8 May 05, 2015  Inhibin B =<7 Jun 02, 2015   Inhibin B = 9.2 June 30, 2015  Inhibin B = 19.6

## 2015-10-14 ENCOUNTER — Other Ambulatory Visit: Payer: Self-pay | Admitting: Oncology

## 2015-10-14 DIAGNOSIS — C562 Malignant neoplasm of left ovary: Secondary | ICD-10-CM

## 2015-10-14 DIAGNOSIS — C787 Secondary malignant neoplasm of liver and intrahepatic bile duct: Secondary | ICD-10-CM

## 2015-10-14 NOTE — Telephone Encounter (Addendum)
Told Felicia Wallace that [facility] hospital HIM sent a copy of her Inhibin levels since 02-2015 including one from 04-07-15.  She does not need to go to hospital to get copies for Dr. Marko Plume. A copy sent to HIM to be scanned into patient's EMR.

## 2015-10-21 ENCOUNTER — Telehealth: Payer: Self-pay

## 2015-10-21 NOTE — Telephone Encounter (Signed)
Phone call from pt asking why her MRI not scheduled yet. She has called her insurance company and [employer] is now covered by her insurance. Order noted. Needs prior authorization. Routing this message to [employer].

## 2015-10-27 ENCOUNTER — Encounter: Payer: Self-pay | Admitting: Oncology

## 2015-10-27 ENCOUNTER — Telehealth: Payer: Self-pay

## 2015-10-27 NOTE — Telephone Encounter (Signed)
Told Felicia Wallace that she has a MRI of abd/pelvis scheduled for 10-29-15 at 2pm at [facility].  She needs to register in radiology at 1345. NPO 4 hrs prior = 10am.

## 2015-10-27 NOTE — Progress Notes (Signed)
Medical Oncology  [place] labs from [place] obtained from patient with previous inhibin levels, as noted below and reports to be scanned into this EMR:  06-30-15  Inhibin B  19.6,   Inhibin A 1.0 06-02-15  Inhibin B 9.2,   Inhibin A  1.1 05-05-15  Inhibin B  <7.0,  Inhibin A 1.6 04-07-15  Inhibin B  8.8 03-10-15  Inhibin B  <7.0 02-10-15  Inhibin B  <7.0  L.Marko Plume, MD

## 2015-10-29 ENCOUNTER — Ambulatory Visit (HOSPITAL_COMMUNITY)
Admission: RE | Admit: 2015-10-29 | Discharge: 2015-10-29 | Disposition: A | Discharge status: Home / Self Care | Payer: PRIVATE HEALTH INSURANCE | Source: Ambulatory Visit | Attending: Oncology | Admitting: Oncology

## 2015-10-29 DIAGNOSIS — D3502 Benign neoplasm of left adrenal gland: Secondary | ICD-10-CM | POA: Insufficient documentation

## 2015-10-29 DIAGNOSIS — K439 Ventral hernia without obstruction or gangrene: Secondary | ICD-10-CM | POA: Insufficient documentation

## 2015-10-29 DIAGNOSIS — N261 Atrophy of kidney (terminal): Secondary | ICD-10-CM | POA: Insufficient documentation

## 2015-10-29 DIAGNOSIS — C562 Malignant neoplasm of left ovary: Secondary | ICD-10-CM

## 2015-10-29 DIAGNOSIS — C787 Secondary malignant neoplasm of liver and intrahepatic bile duct: Secondary | ICD-10-CM | POA: Diagnosis not present

## 2015-10-29 MED ORDER — GADOBENATE DIMEGLUMINE 529 MG/ML IV SOLN
10.0000 mL | Freq: Once | INTRAVENOUS | Status: AC | PRN
  Administered 2015-10-29: 10 mL via INTRAVENOUS

## 2015-10-30 ENCOUNTER — Telehealth: Payer: Self-pay | Admitting: Oncology

## 2015-10-30 NOTE — Telephone Encounter (Signed)
Medical Oncology  Told patient that the MRI abdomen looks stable, no new areas of involvement, largest in liver 1.4 cm. Fat in the ventral hernia. I will send report to Dr Karlyn Agee. Told her that the Inhibin B levels do not always seem to correlate, but this scan information is very useful. She will keep return appointment as scheduled. She appreciated call  L.Marko Plume, MD

## 2015-11-11 ENCOUNTER — Other Ambulatory Visit: Payer: Self-pay | Admitting: *Deleted

## 2015-11-11 MED ORDER — TAMOXIFEN CITRATE 10 MG PO TABS
ORAL_TABLET | ORAL | 1 refills | Status: DC
Start: 2015-11-11 — End: 2016-01-19

## 2015-11-15 ENCOUNTER — Other Ambulatory Visit: Payer: Self-pay | Admitting: Oncology

## 2015-11-15 DIAGNOSIS — C562 Malignant neoplasm of left ovary: Secondary | ICD-10-CM

## 2015-11-17 ENCOUNTER — Encounter: Payer: Self-pay | Admitting: Oncology

## 2015-11-17 ENCOUNTER — Other Ambulatory Visit (HOSPITAL_BASED_OUTPATIENT_CLINIC_OR_DEPARTMENT_OTHER): Payer: PRIVATE HEALTH INSURANCE

## 2015-11-17 ENCOUNTER — Ambulatory Visit (HOSPITAL_BASED_OUTPATIENT_CLINIC_OR_DEPARTMENT_OTHER): Payer: PRIVATE HEALTH INSURANCE | Admitting: Oncology

## 2015-11-17 ENCOUNTER — Ambulatory Visit: Payer: PRIVATE HEALTH INSURANCE

## 2015-11-17 ENCOUNTER — Ambulatory Visit: Payer: PRIVATE HEALTH INSURANCE | Admitting: Nurse Practitioner

## 2015-11-17 VITALS — BP 134/70 | HR 86 | Temp 98.0°F | Resp 18 | Ht 64.0 in | Wt 210.4 lb

## 2015-11-17 DIAGNOSIS — C787 Secondary malignant neoplasm of liver and intrahepatic bile duct: Secondary | ICD-10-CM | POA: Diagnosis not present

## 2015-11-17 DIAGNOSIS — I10 Essential (primary) hypertension: Secondary | ICD-10-CM

## 2015-11-17 DIAGNOSIS — Z720 Tobacco use: Secondary | ICD-10-CM

## 2015-11-17 DIAGNOSIS — Z452 Encounter for adjustment and management of vascular access device: Secondary | ICD-10-CM | POA: Diagnosis not present

## 2015-11-17 DIAGNOSIS — N183 Chronic kidney disease, stage 3 unspecified: Secondary | ICD-10-CM

## 2015-11-17 DIAGNOSIS — C562 Malignant neoplasm of left ovary: Secondary | ICD-10-CM

## 2015-11-17 DIAGNOSIS — F329 Major depressive disorder, single episode, unspecified: Secondary | ICD-10-CM

## 2015-11-17 DIAGNOSIS — Z95828 Presence of other vascular implants and grafts: Secondary | ICD-10-CM

## 2015-11-17 DIAGNOSIS — K439 Ventral hernia without obstruction or gangrene: Secondary | ICD-10-CM

## 2015-11-17 DIAGNOSIS — F17209 Nicotine dependence, unspecified, with unspecified nicotine-induced disorders: Secondary | ICD-10-CM

## 2015-11-17 DIAGNOSIS — G622 Polyneuropathy due to other toxic agents: Secondary | ICD-10-CM

## 2015-11-17 LAB — COMPREHENSIVE METABOLIC PANEL
ALBUMIN: 3.5 g/dL (ref 3.5–5.0)
ALK PHOS: 96 U/L (ref 40–150)
ALT: 9 U/L (ref 0–55)
AST: 10 U/L (ref 5–34)
Anion Gap: 10 mEq/L (ref 3–11)
BUN: 21.3 mg/dL (ref 7.0–26.0)
CO2: 18 meq/L — AB (ref 22–29)
Calcium: 8.9 mg/dL (ref 8.4–10.4)
Chloride: 113 mEq/L — ABNORMAL HIGH (ref 98–109)
Creatinine: 1.4 mg/dL — ABNORMAL HIGH (ref 0.6–1.1)
EGFR: 44 mL/min/{1.73_m2} — AB (ref >90)
GLUCOSE: 118 mg/dL (ref 70–140)
POTASSIUM: 3.8 meq/L (ref 3.5–5.1)
SODIUM: 141 meq/L (ref 136–145)
Total Bilirubin: 0.22 mg/dL (ref 0.20–1.20)
Total Protein: 6.7 g/dL (ref 6.4–8.3)

## 2015-11-17 MED ORDER — HEPARIN SOD (PORK) LOCK FLUSH 100 UNIT/ML IV SOLN
500.0000 [IU] | Freq: Once | INTRAVENOUS | Status: AC | PRN
  Administered 2015-11-17: 500 [IU] via INTRAVENOUS
  Filled 2015-11-17: qty 5

## 2015-11-17 MED ORDER — ALTEPLASE 2 MG IJ SOLR
2.0000 mg | Freq: Once | INTRAMUSCULAR | Status: AC | PRN
  Filled 2015-11-17: qty 2

## 2015-11-17 MED ORDER — SODIUM CHLORIDE 0.9 % IJ SOLN
10.0000 mL | INTRAMUSCULAR | Status: DC | PRN
  Filled 2015-11-17: qty 10

## 2015-11-17 MED ORDER — SODIUM CHLORIDE 0.9 % IJ SOLN
10.0000 mL | INTRAMUSCULAR | Status: DC | PRN
  Administered 2015-11-17 (×2): 10 mL via INTRAVENOUS
  Filled 2015-11-17: qty 10

## 2015-11-17 MED ORDER — SODIUM CHLORIDE 0.9% FLUSH
10.0000 mL | INTRAVENOUS | Status: DC | PRN
  Administered 2015-11-17: 10 mL via INTRAVENOUS
  Filled 2015-11-17: qty 10

## 2015-11-17 MED ORDER — HEPARIN SOD (PORK) LOCK FLUSH 100 UNIT/ML IV SOLN
500.0000 [IU] | Freq: Once | INTRAVENOUS | Status: AC
  Administered 2015-11-17: 500 [IU] via INTRAVENOUS
  Filled 2015-11-17: qty 5

## 2015-11-17 MED ORDER — ALTEPLASE 2 MG IJ SOLR
2.0000 mg | Freq: Once | INTRAMUSCULAR | Status: AC | PRN
  Administered 2015-11-17: 2 mg
  Filled 2015-11-17: qty 2

## 2015-11-17 NOTE — Progress Notes (Signed)
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 She had MRI abdomen 10-29-15 at [facility], stable findings in liver and no new areas of concern  (MRI per my discussion with radiology re CKD/ single functioning kidney and location per her insurance) She saw Dr Marcha Solders about ventral hernia prior to MRI . Repair seems reasonable from standpoint of stable findings on MRI; patient understands taht she has to stop smoking prior to that surgery.  Patient has been gradually more active and more energetic, including some yard work and painting. She continues to smoke 1 - 1.5 ppd, which we have discussed at length; husband also smokes. PCP has adjusted her chronic duragesic patches, used for degenerative arthritis; on present schedule she needs prn pain medication only on day 3. Back pain seems increased with more physical activity and likely also with the ventral hernia. She does wear an abdominal binder. She has more gum soreness and teeth are mobile; she is looking into dental coverage, will use peridex, declines referral to [facility] now. No other abdominal or pelvic discomfort. Appetite better, no N/V. No LE swelling. Denies change in cough, increased SOB or chest pain. No fever. No bleeding. No problems with PAC.  Remainder of 10 point Review of Systems negative.  PAC flushed 11-17-15 Flu vaccine 10-10-15  ONCOLOGIC HISTORY Patient initially noticed unintentional weight loss without other symptoms, 60 lbs over ~ a year (from 260 to 200 lbs). With that weight loss, she noticed a mass in LLQ. She was diagnosed with granulosa cell tumor of left ovary at [facility] and  lymph node sampling 10-23-2013, primary 18.3 cm and high grade features reported. She received carboplatin taxol x 3 cycles from 12-05-13 thru 12-20-13. CT AP 02-11-14 showed no persistent disease, with normal CA 125 and inhibin levels. CT 08-26-14 showed multiple liver metastases confirmed by PET. Liver core needle biopsy at [facility] 09-25-14 confirmed recurrent granulosa cell tumor ([id number]), identical to initial diagnostic pathology, immunostains confirmed and ER + 90%, PR+ 1005 . She was treated with BEP from 10-07-14 thru 03-14-15 (bleo discontinued 11-04-14 due to mucositis and skin reaction. Treatment was changed to tamoxifen alternating with megace every 2 weeks starting 04-07-15.  Inhibin B has been used as marker. 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    Objective:  Vital signs in last 24 hours:  BP 134/70 (BP Location: Left Arm, Patient Position: Sitting)   Pulse 86   Temp 98 F (36.7 C) (Oral)   Resp 18   Ht 5\' 4"  (1.626 m)   Wt 210 lb 6.4 oz (95.4 kg)   SpO2 100%   BMI 36.12 kg/m  Weight is stable Alert, oriented and appropriate. Ambulatory without assistance  No alopecia. Smells of tobacco, smokers cough.  HEENT:PERRL, sclerae not icteric. Oral mucosa moist without lesions, posterior pharynx clear. No  obvious acute dental problems Neck supple. No JVD.  Lymphatics:no cervical,supraclavicular, axillary or inguinal adenopathy Resp: clear to auscultation bilaterally and normal percussion bilaterally Cardio: regular rate and rhythm.  No gallop. GI: soft, nontender, not distended, no mass or organomegaly. Normally active bowel sounds. Large ventral hernia, soft and reducible. Musculoskeletal/ Extremities: LE without pitting edema, cords, tenderness Neuro: no change in residual peripheral neuropathy primarily in feet. Otherwise nonfocal. PSYCH appropriate mood and affect Skin without rash, ecchymosis, petechiae Portacath-without erythema or tenderness, initially no blood return, but resolved with cath flo.  Lab Results:  Results for orders placed or performed in visit on 11/17/15  Comprehensive metabolic panel  Result Value Ref Range   Sodium 141 136 - 145 mEq/L   Potassium 3.8 3.5 - 5.1 mEq/L   Chloride 113 (H) 98 - 109 mEq/L   CO2 18 (L) 22 - 29 mEq/L   Glucose 118 70 - 140 mg/dl   BUN 21.3 7.0 - 26.0 mg/dL   Creatinine 1.4 (H) 0.6 - 1.1 mg/dL   Total Bilirubin 0.22 0.20 - 1.20 mg/dL   Alkaline Phosphatase 96 40 - 150 U/L   AST 10 5 - 34 U/L   ALT 9 0 - 55 U/L   Total Protein 6.7 6.4 - 8.3 g/dL   Albumin 3.5 3.5 - 5.0 g/dL   Calcium 8.9 8.4 - 10.4 mg/dL   Anion Gap 10 3 - 11 mEq/L   EGFR 44 (L) >90 ml/min/1.73 m2  Inhibin B  Result Value Ref Range   Inhibin B 45.4 pg/mL    Inhibin B resule available after visit, see below.  Studies/Results: MRI ABDOMEN AND PELVIS WITHOUT AND WITH CONTRAST  TECHNIQUE: Multiplanar multisequence MR imaging of the abdomen and pelvis was performed both before and after the administration of intravenous contrast.  CONTRAST:  61m MULTIHANCE GADOBENATE DIMEGLUMINE 529 MG/ML IV SOLN  COMPARISON:  CT on 02/21/2015  FINDINGS: Lower Chest: No acute findings.  Hepatobiliary: Although comparison is limited by differences in modalities, several small liver metastases show no definite change. Largest metastasis in the posterior right hepatic lobe measures 1.4 cm on image 28/series 5006. No definite new or enlarging liver metastases identified. Prior cholecystectomy  noted. Stable mild diffuse biliary ductal dilatation, likely due to prior cholecystectomy.  Pancreas:  No mass or inflammatory changes.  Spleen: Within normal limits in size and appearance.  Adrenals/Urinary Tract: Small left adrenal adenoma remains stable. Right adrenal gland is normal in appearance. Diffuse left renal parenchymal atrophy is again seen, without evidence of mass or hydronephrosis. Normal appearance of right kidney.  Stomach/Bowel: No evidence of obstruction, inflammatory process or abnormal fluid collections. Sigmoid diverticulosis is noted, without evidence of diverticulitis.  Vascular/Lymphatic: No pathologically enlarged lymph nodes within the abdomen or pelvis. No abdominal aortic aneurysm.  Reproductive: Previous hysterectomy. Vaginal cuff is normal in appearance. No adnexal or other pelvic masses are identified. No evidence of ascites.  Other: Stable small midline ventral abdominal wall hernia containing only fat.  Musculoskeletal:  No suspicious bone lesions identified.  IMPRESSION: Small hepatic metastases show no significant change allowing for differences in modalities compared to previous CT.  No new or progressive metastatic disease identified within the abdomen or pelvis.  Stable small midline ventral abdominal wall hernia containing only fat.  Stable small benign left adrenal adenoma and diffuse left renal parenchymal atrophy. No evidence of hydronephrosis.   Medications: I have reviewed the patient's current medications.  DISCUSSION  MRI findings reviewed. Inhibin B levels also reviewed, see  previous documentation note for priors. I have told patient that the imaging information is our best indicator, as it has been my experience with other patients that the inhibin B results can be variable. She is glad to continue alternating tamoxifen and megace.  Discussed ventral hernia symptoms, use of abdominal support, need to stop  smoking for hernia repair. Discussed smoking cessation strategies at length with patient and husband together. SHe seems more motivated to address this.  Patient is aware that another medical oncologist will follow her after first of year. I will see her back in 6-8 weeks with PAC flush, at which time we should know more about next physician.   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 BEP 10-07-14 thru 03-14-15, tolerated poorly and bleo held. Now on alternating tamoxifen megace every 2 weeks since 04-07-15  (liver mets strongly ER PR +). Tolerating the hormonal therapy well, and clinically looks better than when I met her in 07-2015. MRI stable findings, which seems better indicator of disease situation than the inhibin B. Continue hormonal treatment. I will see her back with PAC flush in Dec, see above. 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 Falls related to neuropathy, without injury. More active and strength improving now 3.PAC in, needs flush every 6-8 weeks when not otherwise used 4.degenerative arthritis and disc disease, previous cervical spine surgery 5.heavy ongoing tobacco abuse: discussed at length today and hopefully will begin to address. Agree with Dr Ladona Horns that she needs to stop smoking before ventral hernia repair. 6.obesity: likely worsening PS and arthritis symptoms. 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 Affect better today. 9.CKD 3, single functioning kidney: followed at [facility]. Creatinine 1.4 today. Note MRI done  in preference to contrast CT for the granulosa cell tumor due to less nephrotoxic for kidney per [facility] 10. HTN followed by PCP 11. Overdue mammograms, last at [facility]. Message sent to [employer]hip 12. flu vaccine 10-10-15 13. Large ventral hernia: no obstruction. Using binder. Dr Ladona Horns may repair when she stops smoking.    All questions answered. Time spent 25 min including >50% counseling and coordination of care. CC Dr Luan Pulling, Dr Ladona Horns, Dr Luana Shu   Evlyn Clines, MD   11/19/2015, 4:04 PM

## 2015-11-18 LAB — INHIBIN B: INHIBIN B: 45.4 pg/mL

## 2015-11-19 ENCOUNTER — Other Ambulatory Visit: Payer: Self-pay

## 2015-11-19 DIAGNOSIS — C562 Malignant neoplasm of left ovary: Secondary | ICD-10-CM

## 2015-11-19 MED ORDER — CHLORHEXIDINE GLUCONATE 0.12 % MT SOLN: 15.0000 mL | Freq: Two times a day (BID) | OROMUCOSAL | 1 refills | Status: DC

## 2015-11-19 NOTE — Progress Notes (Signed)
peridex prescription  Received: Today  Message Contents  Gordy Levan, MD  Baruch Merl, RN        Please send script for peridex mouthwash bid QS 4 weeks 1 RF

## 2016-01-04 ENCOUNTER — Other Ambulatory Visit: Payer: Self-pay | Admitting: Oncology

## 2016-01-05 ENCOUNTER — Other Ambulatory Visit (HOSPITAL_BASED_OUTPATIENT_CLINIC_OR_DEPARTMENT_OTHER): Payer: PRIVATE HEALTH INSURANCE

## 2016-01-05 ENCOUNTER — Ambulatory Visit: Payer: PRIVATE HEALTH INSURANCE

## 2016-01-05 ENCOUNTER — Encounter: Payer: Self-pay | Admitting: *Deleted

## 2016-01-05 ENCOUNTER — Ambulatory Visit (HOSPITAL_BASED_OUTPATIENT_CLINIC_OR_DEPARTMENT_OTHER): Payer: PRIVATE HEALTH INSURANCE | Admitting: Oncology

## 2016-01-05 ENCOUNTER — Encounter: Payer: Self-pay | Admitting: Oncology

## 2016-01-05 VITALS — BP 116/72 | HR 82 | Temp 98.4°F | Resp 18 | Ht 64.0 in | Wt 215.2 lb

## 2016-01-05 DIAGNOSIS — G620 Drug-induced polyneuropathy: Secondary | ICD-10-CM | POA: Diagnosis not present

## 2016-01-05 DIAGNOSIS — Z95828 Presence of other vascular implants and grafts: Secondary | ICD-10-CM

## 2016-01-05 DIAGNOSIS — K439 Ventral hernia without obstruction or gangrene: Secondary | ICD-10-CM

## 2016-01-05 DIAGNOSIS — N183 Chronic kidney disease, stage 3 unspecified: Secondary | ICD-10-CM

## 2016-01-05 DIAGNOSIS — G8929 Other chronic pain: Secondary | ICD-10-CM

## 2016-01-05 DIAGNOSIS — I10 Essential (primary) hypertension: Secondary | ICD-10-CM

## 2016-01-05 DIAGNOSIS — C787 Secondary malignant neoplasm of liver and intrahepatic bile duct: Secondary | ICD-10-CM | POA: Diagnosis not present

## 2016-01-05 DIAGNOSIS — C562 Malignant neoplasm of left ovary: Secondary | ICD-10-CM

## 2016-01-05 DIAGNOSIS — K219 Gastro-esophageal reflux disease without esophagitis: Secondary | ICD-10-CM

## 2016-01-05 DIAGNOSIS — R0609 Other forms of dyspnea: Secondary | ICD-10-CM

## 2016-01-05 DIAGNOSIS — F329 Major depressive disorder, single episode, unspecified: Secondary | ICD-10-CM

## 2016-01-05 DIAGNOSIS — M545 Low back pain: Secondary | ICD-10-CM

## 2016-01-05 DIAGNOSIS — Z720 Tobacco use: Secondary | ICD-10-CM

## 2016-01-05 DIAGNOSIS — F17209 Nicotine dependence, unspecified, with unspecified nicotine-induced disorders: Secondary | ICD-10-CM

## 2016-01-05 LAB — CBC WITH DIFFERENTIAL/PLATELET
BASO%: 0.8 % (ref 0.0–2.0)
BASOS ABS: 0.1 10*3/uL (ref 0.0–0.1)
EOS%: 1.9 % (ref 0.0–7.0)
Eosinophils Absolute: 0.2 10*3/uL (ref 0.0–0.5)
HEMATOCRIT: 39 % (ref 34.8–46.6)
HGB: 12.5 g/dL (ref 11.6–15.9)
LYMPH%: 28.7 % (ref 14.0–49.7)
MCH: 28.5 pg (ref 25.1–34.0)
MCHC: 32 g/dL (ref 31.5–36.0)
MCV: 89.1 fL (ref 79.5–101.0)
MONO#: 0.7 10*3/uL (ref 0.1–0.9)
MONO%: 6 % (ref 0.0–14.0)
NEUT#: 6.9 10*3/uL — ABNORMAL HIGH (ref 1.5–6.5)
NEUT%: 62.6 % (ref 38.4–76.8)
PLATELETS: 195 10*3/uL (ref 145–400)
RBC: 4.37 10*6/uL (ref 3.70–5.45)
RDW: 16.7 % — ABNORMAL HIGH (ref 11.2–14.5)
WBC: 11 10*3/uL — ABNORMAL HIGH (ref 3.9–10.3)
lymph#: 3.1 10*3/uL (ref 0.9–3.3)

## 2016-01-05 LAB — COMPREHENSIVE METABOLIC PANEL
ALT: 10 U/L (ref 0–55)
ANION GAP: 9 meq/L (ref 3–11)
AST: 11 U/L (ref 5–34)
Albumin: 3.4 g/dL — ABNORMAL LOW (ref 3.5–5.0)
Alkaline Phosphatase: 96 U/L (ref 40–150)
BUN: 24 mg/dL (ref 7.0–26.0)
CALCIUM: 8.4 mg/dL (ref 8.4–10.4)
CO2: 20 meq/L — AB (ref 22–29)
Chloride: 109 mEq/L (ref 98–109)
Creatinine: 1.3 mg/dL — ABNORMAL HIGH (ref 0.6–1.1)
EGFR: 47 mL/min/{1.73_m2} — AB (ref >90)
Glucose: 123 mg/dl (ref 70–140)
POTASSIUM: 3.7 meq/L (ref 3.5–5.1)
Sodium: 139 mEq/L (ref 136–145)
Total Bilirubin: 0.26 mg/dL (ref 0.20–1.20)
Total Protein: 6.5 g/dL (ref 6.4–8.3)

## 2016-01-05 MED ORDER — SODIUM CHLORIDE 0.9 % IJ SOLN
10.0000 mL | INTRAMUSCULAR | Status: DC | PRN
  Administered 2016-01-05: 10 mL via INTRAVENOUS
  Filled 2016-01-05: qty 10

## 2016-01-05 MED ORDER — HEPARIN SOD (PORK) LOCK FLUSH 100 UNIT/ML IV SOLN
500.0000 [IU] | Freq: Once | INTRAVENOUS | Status: AC | PRN
  Administered 2016-01-05: 500 [IU] via INTRAVENOUS
  Filled 2016-01-05: qty 5

## 2016-01-05 NOTE — Patient Instructions (Signed)

## 2016-01-05 NOTE — Progress Notes (Signed)
Oncology Nurse Navigator Documentation  Oncology Nurse Navigator Flowsheets 01/05/2016  Navigator Location [facility]-  Navigator Encounter Type Education/Dr. Marko Plume requested I come to speak with Felicia Wallace regarding smoking cessation.  I did.  I brought information about smoking cessation what to expect and tips on how to help quit.  I discussed with her some options on how to quit.  She was receptive and is ready to quit.  I asked Ms. Sleep to follow up with me if needed to help further.    Barriers/Navigation Needs Education  Education Smoking cessation  Interventions Education  Education Method Verbal;Written  Acuity Level 2  Acuity Level 2 Educational needs  Time Spent with Patient 30

## 2016-01-05 NOTE — Progress Notes (Signed)
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 Most recent imaging was MRI AP 10-29-15 (MRI chosen due to renal concerns).  She is to see Dr Luan Pulling for scheduled visit on 01-08-16.  Patient has a number of mostly chronic concerns today, however continues to tolerate the tamoxifen/ megace well and does not have obvious concern from standpoint of the metastatic granulosa cell tumor.  She had decreased smoking, however is back to nearly 2 ppd now, discussed with this MD and met with navigator for smoking cessation also today. She is SOB with exertion, some wheezing, no increased cough, no hemoptysis, no chest pain. She has long history of GERD, previously controlled with protonix etc, those DCd by nephrologist; she has prn carafate which I have asked her to use ac hs for now. She has some nausea in AMs, no vomiting, She is eating . She is uncomfortable from large ventral hernia, is wearing support garments regularly, understands that Dr Ladona Horns will do repair when she stops smoking. No new or different abdominal or pelvic discomfort. She has soft stools a few times daily on lactulose and stool softeners, prefers this to risking constipation off of that regimen. No fever or symptoms of infection. No problems with PAC, which she is glad to keep for now. Energy is better, is helping friend with home renovation. Chronic back pain controlled on duragesic by Dr Luan Pulling, has not needed any prn pain medications recently. Peripheral neuropathy residual from chemo stable, no recent falls. Remainder of 10 point Review of Systems negative.   PAC flushed 11-17-15 Flu vaccine  10-10-15  ONCOLOGIC HISTORY Patient initially noticed unintentional weight loss without other symptoms, 60 lbs over ~ a year (from 260 to 200 lbs). With that weight loss, she noticed a mass in LLQ. She was diagnosed with granulosa cell tumor of left ovary at [facility] and lymph node sampling 10-23-2013, primary 18.3 cm and high grade features reported. She received carboplatin taxol x 3 cycles from 12-05-13 thru 12-20-13. CT AP 02-11-14 showed no persistent disease, with normal CA 125 and inhibin levels. CT 08-26-14 showed multiple liver metastases confirmed by PET. Liver core needle biopsy at [facility] 09-25-14 confirmed recurrent granulosa cell tumor ([id number]), identical to initial diagnostic pathology, immunostains confirmed and ER + 90%, PR+ 1005 . She was treated with BEP from 10-07-14 thru 03-14-15 (bleo discontinued 11-04-14 due to mucositis and skin reaction. Treatment was changed to tamoxifen alternating with megace every 2 weeks starting 04-07-15.  Inhibin B has been used as marker. Most recent imaging was CT CAP 02-21-15 in [facility] system (report does not show in [place], but received with outside records and will be scanned), compared with 12-06-2014 multiple liver lesions improved, normal right kidney without hydronephrosis, no chest adenopathy, no pulmonary nodules, no bony lesions. 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   Objective:  Vital signs in last 24 hours:  BP 116/72 (BP Location: Left Arm, Patient Position: Sitting)  Pulse 82   Temp 98.4 F (36.9 C) (Oral)   Resp 18   Ht '5\' 4"'$  (1.626 m)   Wt 215 lb 3.2 oz (97.6 kg)   SpO2 98%   BMI 36.94 kg/m  Weight up 5 lbs. Alert,  oriented and appropriate. Ambulatory without assistance, able to get on and off exam table with effort. Voice somewhat raspy as baseline. Smells of tobacco. No alopecia  HEENT:PERRL, sclerae not icteric. Oral mucosa moist without lesions, posterior pharynx clear.  Neck supple. No JVD.  Lymphatics:no cervical,supraclavicular or inguinal adenopathy Resp: diminished BS thruout without wheezes or rales, no dullness to  percussion bilaterally Cardio: regular rate and rhythm. No gallop. GI: abdomen obese, soft, nontender, not distended, no mass or organomegaly. Some bowel sounds.  Musculoskeletal/ Extremities: LE without pitting edema, cords, tenderness Neuro: no change peripheral neuropathy. Otherwise nonfocal. PSYCH brighter affect by completion of visit Skin without rash, ecchymosis, petechiae Portacath-without erythema or tenderness  Lab Results:  Results for orders placed or performed in visit on 01/05/16  CBC with Differential  Result Value Ref Range   WBC 11.0 (H) 3.9 - 10.3 10e3/uL   NEUT# 6.9 (H) 1.5 - 6.5 10e3/uL   HGB 12.5 11.6 - 15.9 g/dL   HCT 39.0 34.8 - 46.6 %   Platelets 195 145 - 400 10e3/uL   MCV 89.1 79.5 - 101.0 fL   MCH 28.5 25.1 - 34.0 pg   MCHC 32.0 31.5 - 36.0 g/dL   RBC 4.37 3.70 - 5.45 10e6/uL   RDW 16.7 (H) 11.2 - 14.5 %   lymph# 3.1 0.9 - 3.3 10e3/uL   MONO# 0.7 0.1 - 0.9 10e3/uL   Eosinophils Absolute 0.2 0.0 - 0.5 10e3/uL   Basophils Absolute 0.1 0.0 - 0.1 10e3/uL   NEUT% 62.6 38.4 - 76.8 %   LYMPH% 28.7 14.0 - 49.7 %   MONO% 6.0 0.0 - 14.0 %   EOS% 1.9 0.0 - 7.0 %   BASO% 0.8 0.0 - 2.0 %  Comprehensive metabolic panel  Result Value Ref Range   Sodium 139 136 - 145 mEq/L   Potassium 3.7 3.5 - 5.1 mEq/L   Chloride 109 98 - 109 mEq/L   CO2 20 (L) 22 - 29 mEq/L   Glucose 123 70 - 140 mg/dl   BUN 24.0 7.0 - 26.0 mg/dL   Creatinine 1.3 (H) 0.6 - 1.1 mg/dL   Total Bilirubin 0.26 0.20 - 1.20 mg/dL   Alkaline Phosphatase 96 40 - 150 U/L   AST 11 5 - 34  U/L   ALT 10 0 - 55 U/L   Total Protein 6.5 6.4 - 8.3 g/dL   Albumin 3.4 (L) 3.5 - 5.0 g/dL   Calcium 8.4 8.4 - 10.4 mg/dL   Anion Gap 9 3 - 11 mEq/L   EGFR 47 (L) >90 ml/min/1.73 m2    Inhibin B level sent and pending.  Studies/Results:  No results found.  Medications: I have reviewed the patient's current medications.  DISCUSSION Discussed smoking cessation as above, which would be very beneficial for her.  Nothing clinically seems of new concern from standpoint of granulosa cell tumor. She understands that granulosa cell tumors often are indolent even when recurrent, and that inhibin B is best marker available but not sensitive or specific as would be ideal.   As I will not be working beyond Jan, she would like to transfer care back to Dr Tressie Stalker, who is now at [facility] in [place]. I have spoken directly  with that office, appointment made for late Jan when she will also be due PAC flush, will send records. Patient is delighted that she can follow with him again - thank you!  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 BEP 10-07-14 thru 03-14-15, tolerated poorly and bleo held. Now on alternating tamoxifen megace every 2 weeks since 04-07-15 (liver mets strongly ER PR +). Tolerating the hormonal therapy well, and clinically looks better than when I met her in 07-2015. MRI stable findings, which seems better indicator of disease situation than the inhibin B. Continue hormonal treatment. She will transfer care back to Dr Tressie Stalker as above.  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 Past falls related to neuropathy, without injury, none recently. More active and strength improving. 3.PAC in, needs flush every 6-8 weeks when not otherwise used 4.degenerative arthritis and disc disease,  previous cervical spine surgery. On chronic pain medication by Dr Luan Pulling, who follows regularly 5.heavy ongoing tobacco abuse: had decreased, but now back to ~ 2 ppd. Discussed at length again today. Per Dr Ladona Horns, needs to stop smoking before ventral hernia repair. 6.obesity: likely worsening PS and arthritis symptoms. 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  9.CKD 3, single functioning kidney: followed at [facility]. Creatinine 1.4 today. Note MRI done in preference to contrast CT for the granulosa cell tumor due to less nephrotoxic for kidney per [facility]. Does not use protonix or related per nephrologist 10. HTN followed by PCP 11. Overdue mammograms, last at [facility] 12. flu vaccine 10-10-15 13. Large ventral hernia: no obstruction. Using binder. Dr Ladona Horns may repair when she stops smoking.  14. Chronic GERD likely multifactorial . She will use carafate 4x daily for now, has this available.  All questions answered and she is in agreement with recommendations and plans. Time spent 20 min including >50% counseling and coordination of care. Cc Drs Luan Pulling, Ladona Horns, [place] and with other interval records to Dr Tressie Stalker (fax [phone])    Evlyn Clines, MD   01/05/2016, 6:18 PM

## 2016-01-09 ENCOUNTER — Telehealth: Payer: Self-pay

## 2016-01-09 LAB — INHIBIN B: INHIBIN B: 97.1 pg/mL

## 2016-01-09 NOTE — Telephone Encounter (Signed)
Notes and labs and med list faxed to Dr Ivin Poot for transferring care

## 2016-01-19 ENCOUNTER — Other Ambulatory Visit: Payer: Self-pay | Admitting: Oncology

## 2016-01-20 NOTE — Telephone Encounter (Signed)
-----   Message from Gordy Levan, MD sent at 01/20/2016  8:16 AM EST ----- Regarding: refill tamoxifen Refill request in my inbasket OK to refill this for #[phone], then Dr Tressie Stalker will do scripts  Thank you

## 2017-03-28 DIAGNOSIS — D3910 Neoplasm of uncertain behavior of unspecified ovary: Secondary | ICD-10-CM | POA: Diagnosis not present

## 2017-03-28 DIAGNOSIS — Z5111 Encounter for antineoplastic chemotherapy: Secondary | ICD-10-CM | POA: Diagnosis not present

## 2017-03-28 DIAGNOSIS — C787 Secondary malignant neoplasm of liver and intrahepatic bile duct: Secondary | ICD-10-CM | POA: Diagnosis not present

## 2017-03-28 DIAGNOSIS — K219 Gastro-esophageal reflux disease without esophagitis: Secondary | ICD-10-CM | POA: Diagnosis not present

## 2017-03-28 DIAGNOSIS — T451X5A Adverse effect of antineoplastic and immunosuppressive drugs, initial encounter: Secondary | ICD-10-CM | POA: Diagnosis not present

## 2017-03-28 DIAGNOSIS — Z90722 Acquired absence of ovaries, bilateral: Secondary | ICD-10-CM | POA: Diagnosis not present

## 2017-03-28 DIAGNOSIS — Z9079 Acquired absence of other genital organ(s): Secondary | ICD-10-CM | POA: Diagnosis not present

## 2017-03-28 DIAGNOSIS — C569 Malignant neoplasm of unspecified ovary: Secondary | ICD-10-CM | POA: Diagnosis not present

## 2017-03-28 DIAGNOSIS — Z90710 Acquired absence of both cervix and uterus: Secondary | ICD-10-CM | POA: Diagnosis not present

## 2017-03-28 DIAGNOSIS — D701 Agranulocytosis secondary to cancer chemotherapy: Secondary | ICD-10-CM | POA: Diagnosis not present

## 2017-04-05 DIAGNOSIS — D3910 Neoplasm of uncertain behavior of unspecified ovary: Secondary | ICD-10-CM | POA: Diagnosis not present

## 2017-04-05 DIAGNOSIS — K210 Gastro-esophageal reflux disease with esophagitis: Secondary | ICD-10-CM | POA: Diagnosis not present

## 2017-04-05 DIAGNOSIS — C569 Malignant neoplasm of unspecified ovary: Secondary | ICD-10-CM | POA: Diagnosis not present

## 2017-04-05 DIAGNOSIS — R490 Dysphonia: Secondary | ICD-10-CM | POA: Diagnosis not present

## 2017-04-11 DIAGNOSIS — T451X5A Adverse effect of antineoplastic and immunosuppressive drugs, initial encounter: Secondary | ICD-10-CM | POA: Diagnosis not present

## 2017-04-11 DIAGNOSIS — D701 Agranulocytosis secondary to cancer chemotherapy: Secondary | ICD-10-CM | POA: Diagnosis not present

## 2017-04-11 DIAGNOSIS — C787 Secondary malignant neoplasm of liver and intrahepatic bile duct: Secondary | ICD-10-CM | POA: Diagnosis not present

## 2017-04-11 DIAGNOSIS — C569 Malignant neoplasm of unspecified ovary: Secondary | ICD-10-CM | POA: Diagnosis not present

## 2017-04-11 DIAGNOSIS — Z5111 Encounter for antineoplastic chemotherapy: Secondary | ICD-10-CM | POA: Diagnosis not present

## 2017-05-05 ENCOUNTER — Ambulatory Visit (INDEPENDENT_AMBULATORY_CARE_PROVIDER_SITE_OTHER): Payer: Medicare Other | Admitting: Otolaryngology

## 2017-05-05 DIAGNOSIS — R490 Dysphonia: Secondary | ICD-10-CM | POA: Diagnosis not present

## 2017-05-05 DIAGNOSIS — J382 Nodules of vocal cords: Secondary | ICD-10-CM | POA: Diagnosis not present

## 2017-05-09 DIAGNOSIS — R490 Dysphonia: Secondary | ICD-10-CM | POA: Diagnosis not present

## 2017-05-09 DIAGNOSIS — C562 Malignant neoplasm of left ovary: Secondary | ICD-10-CM | POA: Diagnosis not present

## 2017-05-09 DIAGNOSIS — Z90710 Acquired absence of both cervix and uterus: Secondary | ICD-10-CM | POA: Diagnosis not present

## 2017-05-09 DIAGNOSIS — D649 Anemia, unspecified: Secondary | ICD-10-CM | POA: Diagnosis not present

## 2017-05-09 DIAGNOSIS — C787 Secondary malignant neoplasm of liver and intrahepatic bile duct: Secondary | ICD-10-CM | POA: Diagnosis not present

## 2017-05-09 DIAGNOSIS — R05 Cough: Secondary | ICD-10-CM | POA: Diagnosis not present

## 2017-05-09 DIAGNOSIS — Z9079 Acquired absence of other genital organ(s): Secondary | ICD-10-CM | POA: Diagnosis not present

## 2017-05-09 DIAGNOSIS — D3910 Neoplasm of uncertain behavior of unspecified ovary: Secondary | ICD-10-CM | POA: Diagnosis not present

## 2017-05-09 DIAGNOSIS — Z90722 Acquired absence of ovaries, bilateral: Secondary | ICD-10-CM | POA: Diagnosis not present

## 2017-05-10 DIAGNOSIS — C787 Secondary malignant neoplasm of liver and intrahepatic bile duct: Secondary | ICD-10-CM | POA: Diagnosis not present

## 2017-05-10 DIAGNOSIS — D3910 Neoplasm of uncertain behavior of unspecified ovary: Secondary | ICD-10-CM | POA: Diagnosis not present

## 2017-05-10 DIAGNOSIS — D649 Anemia, unspecified: Secondary | ICD-10-CM | POA: Diagnosis not present

## 2017-05-10 DIAGNOSIS — Z452 Encounter for adjustment and management of vascular access device: Secondary | ICD-10-CM | POA: Diagnosis not present

## 2017-05-12 DIAGNOSIS — R490 Dysphonia: Secondary | ICD-10-CM | POA: Diagnosis not present

## 2017-05-12 DIAGNOSIS — R05 Cough: Secondary | ICD-10-CM | POA: Diagnosis not present

## 2017-05-12 DIAGNOSIS — Z9889 Other specified postprocedural states: Secondary | ICD-10-CM | POA: Diagnosis not present

## 2017-05-12 DIAGNOSIS — D3912 Neoplasm of uncertain behavior of left ovary: Secondary | ICD-10-CM | POA: Diagnosis not present

## 2017-05-12 DIAGNOSIS — C787 Secondary malignant neoplasm of liver and intrahepatic bile duct: Secondary | ICD-10-CM | POA: Diagnosis not present

## 2017-05-16 DIAGNOSIS — R490 Dysphonia: Secondary | ICD-10-CM | POA: Diagnosis not present

## 2017-05-16 DIAGNOSIS — Z90722 Acquired absence of ovaries, bilateral: Secondary | ICD-10-CM | POA: Diagnosis not present

## 2017-05-16 DIAGNOSIS — D649 Anemia, unspecified: Secondary | ICD-10-CM | POA: Diagnosis not present

## 2017-05-16 DIAGNOSIS — Z9079 Acquired absence of other genital organ(s): Secondary | ICD-10-CM | POA: Diagnosis not present

## 2017-05-16 DIAGNOSIS — C562 Malignant neoplasm of left ovary: Secondary | ICD-10-CM | POA: Diagnosis not present

## 2017-05-16 DIAGNOSIS — C787 Secondary malignant neoplasm of liver and intrahepatic bile duct: Secondary | ICD-10-CM | POA: Diagnosis not present

## 2017-05-16 DIAGNOSIS — Z90710 Acquired absence of both cervix and uterus: Secondary | ICD-10-CM | POA: Diagnosis not present

## 2017-05-16 DIAGNOSIS — R05 Cough: Secondary | ICD-10-CM | POA: Diagnosis not present

## 2017-05-16 DIAGNOSIS — D3910 Neoplasm of uncertain behavior of unspecified ovary: Secondary | ICD-10-CM | POA: Diagnosis not present

## 2017-05-17 DIAGNOSIS — R112 Nausea with vomiting, unspecified: Secondary | ICD-10-CM | POA: Diagnosis not present

## 2017-05-17 DIAGNOSIS — K219 Gastro-esophageal reflux disease without esophagitis: Secondary | ICD-10-CM | POA: Diagnosis not present

## 2017-05-17 DIAGNOSIS — R531 Weakness: Secondary | ICD-10-CM | POA: Diagnosis not present

## 2017-05-17 DIAGNOSIS — C569 Malignant neoplasm of unspecified ovary: Secondary | ICD-10-CM | POA: Diagnosis not present

## 2017-05-17 DIAGNOSIS — R52 Pain, unspecified: Secondary | ICD-10-CM | POA: Diagnosis not present

## 2017-05-17 DIAGNOSIS — I10 Essential (primary) hypertension: Secondary | ICD-10-CM | POA: Diagnosis not present

## 2017-05-17 DIAGNOSIS — R0602 Shortness of breath: Secondary | ICD-10-CM | POA: Diagnosis not present

## 2017-05-18 DIAGNOSIS — R0602 Shortness of breath: Secondary | ICD-10-CM | POA: Diagnosis not present

## 2017-05-18 DIAGNOSIS — C569 Malignant neoplasm of unspecified ovary: Secondary | ICD-10-CM | POA: Diagnosis not present

## 2017-05-18 DIAGNOSIS — I10 Essential (primary) hypertension: Secondary | ICD-10-CM | POA: Diagnosis not present

## 2017-05-18 DIAGNOSIS — R531 Weakness: Secondary | ICD-10-CM | POA: Diagnosis not present

## 2017-05-18 DIAGNOSIS — K219 Gastro-esophageal reflux disease without esophagitis: Secondary | ICD-10-CM | POA: Diagnosis not present

## 2017-05-18 DIAGNOSIS — R112 Nausea with vomiting, unspecified: Secondary | ICD-10-CM | POA: Diagnosis not present

## 2017-05-18 DIAGNOSIS — R52 Pain, unspecified: Secondary | ICD-10-CM | POA: Diagnosis not present

## 2017-05-23 DIAGNOSIS — R52 Pain, unspecified: Secondary | ICD-10-CM | POA: Diagnosis not present

## 2017-05-23 DIAGNOSIS — I10 Essential (primary) hypertension: Secondary | ICD-10-CM | POA: Diagnosis not present

## 2017-05-23 DIAGNOSIS — R531 Weakness: Secondary | ICD-10-CM | POA: Diagnosis not present

## 2017-05-23 DIAGNOSIS — R112 Nausea with vomiting, unspecified: Secondary | ICD-10-CM | POA: Diagnosis not present

## 2017-05-23 DIAGNOSIS — K219 Gastro-esophageal reflux disease without esophagitis: Secondary | ICD-10-CM | POA: Diagnosis not present

## 2017-05-23 DIAGNOSIS — C569 Malignant neoplasm of unspecified ovary: Secondary | ICD-10-CM | POA: Diagnosis not present

## 2017-05-26 DIAGNOSIS — R52 Pain, unspecified: Secondary | ICD-10-CM | POA: Diagnosis not present

## 2017-05-26 DIAGNOSIS — C569 Malignant neoplasm of unspecified ovary: Secondary | ICD-10-CM | POA: Diagnosis not present

## 2017-05-26 DIAGNOSIS — K219 Gastro-esophageal reflux disease without esophagitis: Secondary | ICD-10-CM | POA: Diagnosis not present

## 2017-05-26 DIAGNOSIS — R112 Nausea with vomiting, unspecified: Secondary | ICD-10-CM | POA: Diagnosis not present

## 2017-05-26 DIAGNOSIS — I10 Essential (primary) hypertension: Secondary | ICD-10-CM | POA: Diagnosis not present

## 2017-05-26 DIAGNOSIS — R531 Weakness: Secondary | ICD-10-CM | POA: Diagnosis not present

## 2017-05-30 DIAGNOSIS — K219 Gastro-esophageal reflux disease without esophagitis: Secondary | ICD-10-CM | POA: Diagnosis not present

## 2017-05-30 DIAGNOSIS — R112 Nausea with vomiting, unspecified: Secondary | ICD-10-CM | POA: Diagnosis not present

## 2017-05-30 DIAGNOSIS — R531 Weakness: Secondary | ICD-10-CM | POA: Diagnosis not present

## 2017-05-30 DIAGNOSIS — I10 Essential (primary) hypertension: Secondary | ICD-10-CM | POA: Diagnosis not present

## 2017-05-30 DIAGNOSIS — C569 Malignant neoplasm of unspecified ovary: Secondary | ICD-10-CM | POA: Diagnosis not present

## 2017-05-30 DIAGNOSIS — R52 Pain, unspecified: Secondary | ICD-10-CM | POA: Diagnosis not present

## 2017-06-07 DIAGNOSIS — R112 Nausea with vomiting, unspecified: Secondary | ICD-10-CM | POA: Diagnosis not present

## 2017-06-07 DIAGNOSIS — R531 Weakness: Secondary | ICD-10-CM | POA: Diagnosis not present

## 2017-06-07 DIAGNOSIS — C569 Malignant neoplasm of unspecified ovary: Secondary | ICD-10-CM | POA: Diagnosis not present

## 2017-06-07 DIAGNOSIS — K219 Gastro-esophageal reflux disease without esophagitis: Secondary | ICD-10-CM | POA: Diagnosis not present

## 2017-06-07 DIAGNOSIS — I10 Essential (primary) hypertension: Secondary | ICD-10-CM | POA: Diagnosis not present

## 2017-06-07 DIAGNOSIS — R52 Pain, unspecified: Secondary | ICD-10-CM | POA: Diagnosis not present

## 2017-06-14 DIAGNOSIS — I10 Essential (primary) hypertension: Secondary | ICD-10-CM | POA: Diagnosis not present

## 2017-06-14 DIAGNOSIS — R112 Nausea with vomiting, unspecified: Secondary | ICD-10-CM | POA: Diagnosis not present

## 2017-06-14 DIAGNOSIS — R531 Weakness: Secondary | ICD-10-CM | POA: Diagnosis not present

## 2017-06-14 DIAGNOSIS — C569 Malignant neoplasm of unspecified ovary: Secondary | ICD-10-CM | POA: Diagnosis not present

## 2017-06-14 DIAGNOSIS — R52 Pain, unspecified: Secondary | ICD-10-CM | POA: Diagnosis not present

## 2017-06-14 DIAGNOSIS — K219 Gastro-esophageal reflux disease without esophagitis: Secondary | ICD-10-CM | POA: Diagnosis not present

## 2017-06-15 DIAGNOSIS — I10 Essential (primary) hypertension: Secondary | ICD-10-CM | POA: Diagnosis not present

## 2017-06-15 DIAGNOSIS — R52 Pain, unspecified: Secondary | ICD-10-CM | POA: Diagnosis not present

## 2017-06-15 DIAGNOSIS — C569 Malignant neoplasm of unspecified ovary: Secondary | ICD-10-CM | POA: Diagnosis not present

## 2017-06-15 DIAGNOSIS — R531 Weakness: Secondary | ICD-10-CM | POA: Diagnosis not present

## 2017-06-15 DIAGNOSIS — R112 Nausea with vomiting, unspecified: Secondary | ICD-10-CM | POA: Diagnosis not present

## 2017-06-15 DIAGNOSIS — K219 Gastro-esophageal reflux disease without esophagitis: Secondary | ICD-10-CM | POA: Diagnosis not present

## 2017-06-18 DIAGNOSIS — R112 Nausea with vomiting, unspecified: Secondary | ICD-10-CM | POA: Diagnosis not present

## 2017-06-18 DIAGNOSIS — R52 Pain, unspecified: Secondary | ICD-10-CM | POA: Diagnosis not present

## 2017-06-18 DIAGNOSIS — I10 Essential (primary) hypertension: Secondary | ICD-10-CM | POA: Diagnosis not present

## 2017-06-18 DIAGNOSIS — R531 Weakness: Secondary | ICD-10-CM | POA: Diagnosis not present

## 2017-06-18 DIAGNOSIS — K219 Gastro-esophageal reflux disease without esophagitis: Secondary | ICD-10-CM | POA: Diagnosis not present

## 2017-06-18 DIAGNOSIS — R0602 Shortness of breath: Secondary | ICD-10-CM | POA: Diagnosis not present

## 2017-06-18 DIAGNOSIS — C569 Malignant neoplasm of unspecified ovary: Secondary | ICD-10-CM | POA: Diagnosis not present

## 2017-06-20 DIAGNOSIS — R112 Nausea with vomiting, unspecified: Secondary | ICD-10-CM | POA: Diagnosis not present

## 2017-06-20 DIAGNOSIS — R52 Pain, unspecified: Secondary | ICD-10-CM | POA: Diagnosis not present

## 2017-06-20 DIAGNOSIS — R531 Weakness: Secondary | ICD-10-CM | POA: Diagnosis not present

## 2017-06-20 DIAGNOSIS — K219 Gastro-esophageal reflux disease without esophagitis: Secondary | ICD-10-CM | POA: Diagnosis not present

## 2017-06-20 DIAGNOSIS — C569 Malignant neoplasm of unspecified ovary: Secondary | ICD-10-CM | POA: Diagnosis not present

## 2017-06-20 DIAGNOSIS — I10 Essential (primary) hypertension: Secondary | ICD-10-CM | POA: Diagnosis not present

## 2017-06-27 DIAGNOSIS — R531 Weakness: Secondary | ICD-10-CM | POA: Diagnosis not present

## 2017-06-27 DIAGNOSIS — R112 Nausea with vomiting, unspecified: Secondary | ICD-10-CM | POA: Diagnosis not present

## 2017-06-27 DIAGNOSIS — C569 Malignant neoplasm of unspecified ovary: Secondary | ICD-10-CM | POA: Diagnosis not present

## 2017-06-27 DIAGNOSIS — I10 Essential (primary) hypertension: Secondary | ICD-10-CM | POA: Diagnosis not present

## 2017-06-27 DIAGNOSIS — R52 Pain, unspecified: Secondary | ICD-10-CM | POA: Diagnosis not present

## 2017-06-27 DIAGNOSIS — K219 Gastro-esophageal reflux disease without esophagitis: Secondary | ICD-10-CM | POA: Diagnosis not present

## 2017-06-29 DIAGNOSIS — R531 Weakness: Secondary | ICD-10-CM | POA: Diagnosis not present

## 2017-06-29 DIAGNOSIS — R112 Nausea with vomiting, unspecified: Secondary | ICD-10-CM | POA: Diagnosis not present

## 2017-06-29 DIAGNOSIS — I10 Essential (primary) hypertension: Secondary | ICD-10-CM | POA: Diagnosis not present

## 2017-06-29 DIAGNOSIS — K219 Gastro-esophageal reflux disease without esophagitis: Secondary | ICD-10-CM | POA: Diagnosis not present

## 2017-06-29 DIAGNOSIS — R52 Pain, unspecified: Secondary | ICD-10-CM | POA: Diagnosis not present

## 2017-06-29 DIAGNOSIS — C569 Malignant neoplasm of unspecified ovary: Secondary | ICD-10-CM | POA: Diagnosis not present

## 2017-07-04 DIAGNOSIS — R531 Weakness: Secondary | ICD-10-CM | POA: Diagnosis not present

## 2017-07-04 DIAGNOSIS — C569 Malignant neoplasm of unspecified ovary: Secondary | ICD-10-CM | POA: Diagnosis not present

## 2017-07-04 DIAGNOSIS — I10 Essential (primary) hypertension: Secondary | ICD-10-CM | POA: Diagnosis not present

## 2017-07-04 DIAGNOSIS — R52 Pain, unspecified: Secondary | ICD-10-CM | POA: Diagnosis not present

## 2017-07-04 DIAGNOSIS — R112 Nausea with vomiting, unspecified: Secondary | ICD-10-CM | POA: Diagnosis not present

## 2017-07-04 DIAGNOSIS — K219 Gastro-esophageal reflux disease without esophagitis: Secondary | ICD-10-CM | POA: Diagnosis not present

## 2017-07-08 DIAGNOSIS — C569 Malignant neoplasm of unspecified ovary: Secondary | ICD-10-CM | POA: Diagnosis not present

## 2017-07-08 DIAGNOSIS — R531 Weakness: Secondary | ICD-10-CM | POA: Diagnosis not present

## 2017-07-08 DIAGNOSIS — R112 Nausea with vomiting, unspecified: Secondary | ICD-10-CM | POA: Diagnosis not present

## 2017-07-08 DIAGNOSIS — R52 Pain, unspecified: Secondary | ICD-10-CM | POA: Diagnosis not present

## 2017-07-08 DIAGNOSIS — I10 Essential (primary) hypertension: Secondary | ICD-10-CM | POA: Diagnosis not present

## 2017-07-08 DIAGNOSIS — K219 Gastro-esophageal reflux disease without esophagitis: Secondary | ICD-10-CM | POA: Diagnosis not present

## 2017-07-11 DIAGNOSIS — C569 Malignant neoplasm of unspecified ovary: Secondary | ICD-10-CM | POA: Diagnosis not present

## 2017-07-11 DIAGNOSIS — R531 Weakness: Secondary | ICD-10-CM | POA: Diagnosis not present

## 2017-07-11 DIAGNOSIS — R112 Nausea with vomiting, unspecified: Secondary | ICD-10-CM | POA: Diagnosis not present

## 2017-07-11 DIAGNOSIS — I10 Essential (primary) hypertension: Secondary | ICD-10-CM | POA: Diagnosis not present

## 2017-07-11 DIAGNOSIS — K219 Gastro-esophageal reflux disease without esophagitis: Secondary | ICD-10-CM | POA: Diagnosis not present

## 2017-07-11 DIAGNOSIS — R52 Pain, unspecified: Secondary | ICD-10-CM | POA: Diagnosis not present

## 2017-07-12 ENCOUNTER — Encounter (HOSPITAL_COMMUNITY)
Admission: RE | Admit: 2017-07-12 | Discharge: 2017-07-12 | Disposition: A | Discharge status: Home / Self Care | Source: Ambulatory Visit | Attending: Pulmonary Disease | Admitting: Pulmonary Disease

## 2017-07-12 DIAGNOSIS — F419 Anxiety disorder, unspecified: Secondary | ICD-10-CM | POA: Diagnosis not present

## 2017-07-12 DIAGNOSIS — I10 Essential (primary) hypertension: Secondary | ICD-10-CM | POA: Diagnosis not present

## 2017-07-12 DIAGNOSIS — Z451 Encounter for adjustment and management of infusion pump: Secondary | ICD-10-CM | POA: Diagnosis not present

## 2017-07-12 DIAGNOSIS — C569 Malignant neoplasm of unspecified ovary: Secondary | ICD-10-CM | POA: Diagnosis not present

## 2017-07-12 DIAGNOSIS — J449 Chronic obstructive pulmonary disease, unspecified: Secondary | ICD-10-CM | POA: Diagnosis not present

## 2017-07-12 MED ORDER — HEPARIN SOD (PORK) LOCK FLUSH 100 UNIT/ML IV SOLN
500.0000 [IU] | INTRAVENOUS | Status: AC | PRN
  Administered 2017-07-12: 500 [IU]

## 2017-07-12 MED ORDER — SODIUM CHLORIDE 0.9% FLUSH: 10.0000 mL | INTRAVENOUS | Status: DC | PRN

## 2017-07-12 NOTE — Progress Notes (Signed)
R subclavain PAC accessed without difficulty. Blood return noted. Heparin instilled.

## 2017-07-18 DIAGNOSIS — R112 Nausea with vomiting, unspecified: Secondary | ICD-10-CM | POA: Diagnosis not present

## 2017-07-18 DIAGNOSIS — I10 Essential (primary) hypertension: Secondary | ICD-10-CM | POA: Diagnosis not present

## 2017-07-18 DIAGNOSIS — K219 Gastro-esophageal reflux disease without esophagitis: Secondary | ICD-10-CM | POA: Diagnosis not present

## 2017-07-18 DIAGNOSIS — R0602 Shortness of breath: Secondary | ICD-10-CM | POA: Diagnosis not present

## 2017-07-18 DIAGNOSIS — C569 Malignant neoplasm of unspecified ovary: Secondary | ICD-10-CM | POA: Diagnosis not present

## 2017-07-18 DIAGNOSIS — R52 Pain, unspecified: Secondary | ICD-10-CM | POA: Diagnosis not present

## 2017-07-18 DIAGNOSIS — R531 Weakness: Secondary | ICD-10-CM | POA: Diagnosis not present

## 2017-07-25 DIAGNOSIS — K219 Gastro-esophageal reflux disease without esophagitis: Secondary | ICD-10-CM | POA: Diagnosis not present

## 2017-07-25 DIAGNOSIS — C569 Malignant neoplasm of unspecified ovary: Secondary | ICD-10-CM | POA: Diagnosis not present

## 2017-07-25 DIAGNOSIS — R112 Nausea with vomiting, unspecified: Secondary | ICD-10-CM | POA: Diagnosis not present

## 2017-07-25 DIAGNOSIS — R531 Weakness: Secondary | ICD-10-CM | POA: Diagnosis not present

## 2017-07-25 DIAGNOSIS — I10 Essential (primary) hypertension: Secondary | ICD-10-CM | POA: Diagnosis not present

## 2017-07-25 DIAGNOSIS — R52 Pain, unspecified: Secondary | ICD-10-CM | POA: Diagnosis not present

## 2017-07-29 DIAGNOSIS — R52 Pain, unspecified: Secondary | ICD-10-CM | POA: Diagnosis not present

## 2017-07-29 DIAGNOSIS — C569 Malignant neoplasm of unspecified ovary: Secondary | ICD-10-CM | POA: Diagnosis not present

## 2017-07-29 DIAGNOSIS — K219 Gastro-esophageal reflux disease without esophagitis: Secondary | ICD-10-CM | POA: Diagnosis not present

## 2017-07-29 DIAGNOSIS — I10 Essential (primary) hypertension: Secondary | ICD-10-CM | POA: Diagnosis not present

## 2017-07-29 DIAGNOSIS — R112 Nausea with vomiting, unspecified: Secondary | ICD-10-CM | POA: Diagnosis not present

## 2017-07-29 DIAGNOSIS — R531 Weakness: Secondary | ICD-10-CM | POA: Diagnosis not present

## 2017-08-01 DIAGNOSIS — R112 Nausea with vomiting, unspecified: Secondary | ICD-10-CM | POA: Diagnosis not present

## 2017-08-01 DIAGNOSIS — R531 Weakness: Secondary | ICD-10-CM | POA: Diagnosis not present

## 2017-08-01 DIAGNOSIS — I10 Essential (primary) hypertension: Secondary | ICD-10-CM | POA: Diagnosis not present

## 2017-08-01 DIAGNOSIS — K219 Gastro-esophageal reflux disease without esophagitis: Secondary | ICD-10-CM | POA: Diagnosis not present

## 2017-08-01 DIAGNOSIS — C569 Malignant neoplasm of unspecified ovary: Secondary | ICD-10-CM | POA: Diagnosis not present

## 2017-08-01 DIAGNOSIS — R52 Pain, unspecified: Secondary | ICD-10-CM | POA: Diagnosis not present

## 2017-08-08 DIAGNOSIS — R52 Pain, unspecified: Secondary | ICD-10-CM | POA: Diagnosis not present

## 2017-08-08 DIAGNOSIS — R112 Nausea with vomiting, unspecified: Secondary | ICD-10-CM | POA: Diagnosis not present

## 2017-08-08 DIAGNOSIS — I10 Essential (primary) hypertension: Secondary | ICD-10-CM | POA: Diagnosis not present

## 2017-08-08 DIAGNOSIS — R531 Weakness: Secondary | ICD-10-CM | POA: Diagnosis not present

## 2017-08-08 DIAGNOSIS — C569 Malignant neoplasm of unspecified ovary: Secondary | ICD-10-CM | POA: Diagnosis not present

## 2017-08-08 DIAGNOSIS — K219 Gastro-esophageal reflux disease without esophagitis: Secondary | ICD-10-CM | POA: Diagnosis not present

## 2017-08-09 DIAGNOSIS — R52 Pain, unspecified: Secondary | ICD-10-CM | POA: Diagnosis not present

## 2017-08-09 DIAGNOSIS — C569 Malignant neoplasm of unspecified ovary: Secondary | ICD-10-CM | POA: Diagnosis not present

## 2017-08-09 DIAGNOSIS — R531 Weakness: Secondary | ICD-10-CM | POA: Diagnosis not present

## 2017-08-09 DIAGNOSIS — I10 Essential (primary) hypertension: Secondary | ICD-10-CM | POA: Diagnosis not present

## 2017-08-09 DIAGNOSIS — R112 Nausea with vomiting, unspecified: Secondary | ICD-10-CM | POA: Diagnosis not present

## 2017-08-09 DIAGNOSIS — K219 Gastro-esophageal reflux disease without esophagitis: Secondary | ICD-10-CM | POA: Diagnosis not present

## 2017-08-15 DIAGNOSIS — I10 Essential (primary) hypertension: Secondary | ICD-10-CM | POA: Diagnosis not present

## 2017-08-15 DIAGNOSIS — R112 Nausea with vomiting, unspecified: Secondary | ICD-10-CM | POA: Diagnosis not present

## 2017-08-15 DIAGNOSIS — R52 Pain, unspecified: Secondary | ICD-10-CM | POA: Diagnosis not present

## 2017-08-15 DIAGNOSIS — K219 Gastro-esophageal reflux disease without esophagitis: Secondary | ICD-10-CM | POA: Diagnosis not present

## 2017-08-15 DIAGNOSIS — C569 Malignant neoplasm of unspecified ovary: Secondary | ICD-10-CM | POA: Diagnosis not present

## 2017-08-15 DIAGNOSIS — R531 Weakness: Secondary | ICD-10-CM | POA: Diagnosis not present

## 2017-08-18 DIAGNOSIS — R531 Weakness: Secondary | ICD-10-CM | POA: Diagnosis not present

## 2017-08-18 DIAGNOSIS — I10 Essential (primary) hypertension: Secondary | ICD-10-CM | POA: Diagnosis not present

## 2017-08-18 DIAGNOSIS — C569 Malignant neoplasm of unspecified ovary: Secondary | ICD-10-CM | POA: Diagnosis not present

## 2017-08-18 DIAGNOSIS — R112 Nausea with vomiting, unspecified: Secondary | ICD-10-CM | POA: Diagnosis not present

## 2017-08-18 DIAGNOSIS — R0602 Shortness of breath: Secondary | ICD-10-CM | POA: Diagnosis not present

## 2017-08-18 DIAGNOSIS — K219 Gastro-esophageal reflux disease without esophagitis: Secondary | ICD-10-CM | POA: Diagnosis not present

## 2017-08-18 DIAGNOSIS — R52 Pain, unspecified: Secondary | ICD-10-CM | POA: Diagnosis not present

## 2017-08-22 DIAGNOSIS — K219 Gastro-esophageal reflux disease without esophagitis: Secondary | ICD-10-CM | POA: Diagnosis not present

## 2017-08-22 DIAGNOSIS — I10 Essential (primary) hypertension: Secondary | ICD-10-CM | POA: Diagnosis not present

## 2017-08-22 DIAGNOSIS — R112 Nausea with vomiting, unspecified: Secondary | ICD-10-CM | POA: Diagnosis not present

## 2017-08-22 DIAGNOSIS — R52 Pain, unspecified: Secondary | ICD-10-CM | POA: Diagnosis not present

## 2017-08-22 DIAGNOSIS — C569 Malignant neoplasm of unspecified ovary: Secondary | ICD-10-CM | POA: Diagnosis not present

## 2017-08-22 DIAGNOSIS — R531 Weakness: Secondary | ICD-10-CM | POA: Diagnosis not present

## 2017-08-23 DIAGNOSIS — K219 Gastro-esophageal reflux disease without esophagitis: Secondary | ICD-10-CM | POA: Diagnosis not present

## 2017-08-23 DIAGNOSIS — I10 Essential (primary) hypertension: Secondary | ICD-10-CM | POA: Diagnosis not present

## 2017-08-23 DIAGNOSIS — R531 Weakness: Secondary | ICD-10-CM | POA: Diagnosis not present

## 2017-08-23 DIAGNOSIS — R112 Nausea with vomiting, unspecified: Secondary | ICD-10-CM | POA: Diagnosis not present

## 2017-08-23 DIAGNOSIS — R52 Pain, unspecified: Secondary | ICD-10-CM | POA: Diagnosis not present

## 2017-08-23 DIAGNOSIS — C569 Malignant neoplasm of unspecified ovary: Secondary | ICD-10-CM | POA: Diagnosis not present

## 2017-08-29 DIAGNOSIS — R531 Weakness: Secondary | ICD-10-CM | POA: Diagnosis not present

## 2017-08-29 DIAGNOSIS — C569 Malignant neoplasm of unspecified ovary: Secondary | ICD-10-CM | POA: Diagnosis not present

## 2017-08-29 DIAGNOSIS — I10 Essential (primary) hypertension: Secondary | ICD-10-CM | POA: Diagnosis not present

## 2017-08-29 DIAGNOSIS — R112 Nausea with vomiting, unspecified: Secondary | ICD-10-CM | POA: Diagnosis not present

## 2017-08-29 DIAGNOSIS — R52 Pain, unspecified: Secondary | ICD-10-CM | POA: Diagnosis not present

## 2017-08-29 DIAGNOSIS — K219 Gastro-esophageal reflux disease without esophagitis: Secondary | ICD-10-CM | POA: Diagnosis not present

## 2017-09-02 DIAGNOSIS — C569 Malignant neoplasm of unspecified ovary: Secondary | ICD-10-CM | POA: Diagnosis not present

## 2017-09-02 DIAGNOSIS — F419 Anxiety disorder, unspecified: Secondary | ICD-10-CM | POA: Diagnosis not present

## 2017-09-02 DIAGNOSIS — I10 Essential (primary) hypertension: Secondary | ICD-10-CM | POA: Diagnosis not present

## 2017-09-02 DIAGNOSIS — D649 Anemia, unspecified: Secondary | ICD-10-CM | POA: Diagnosis not present

## 2017-09-05 DIAGNOSIS — R112 Nausea with vomiting, unspecified: Secondary | ICD-10-CM | POA: Diagnosis not present

## 2017-09-05 DIAGNOSIS — K219 Gastro-esophageal reflux disease without esophagitis: Secondary | ICD-10-CM | POA: Diagnosis not present

## 2017-09-05 DIAGNOSIS — R531 Weakness: Secondary | ICD-10-CM | POA: Diagnosis not present

## 2017-09-05 DIAGNOSIS — R52 Pain, unspecified: Secondary | ICD-10-CM | POA: Diagnosis not present

## 2017-09-05 DIAGNOSIS — C569 Malignant neoplasm of unspecified ovary: Secondary | ICD-10-CM | POA: Diagnosis not present

## 2017-09-05 DIAGNOSIS — I10 Essential (primary) hypertension: Secondary | ICD-10-CM | POA: Diagnosis not present

## 2017-09-12 DIAGNOSIS — R112 Nausea with vomiting, unspecified: Secondary | ICD-10-CM | POA: Diagnosis not present

## 2017-09-12 DIAGNOSIS — K219 Gastro-esophageal reflux disease without esophagitis: Secondary | ICD-10-CM | POA: Diagnosis not present

## 2017-09-12 DIAGNOSIS — R52 Pain, unspecified: Secondary | ICD-10-CM | POA: Diagnosis not present

## 2017-09-12 DIAGNOSIS — R531 Weakness: Secondary | ICD-10-CM | POA: Diagnosis not present

## 2017-09-12 DIAGNOSIS — I10 Essential (primary) hypertension: Secondary | ICD-10-CM | POA: Diagnosis not present

## 2017-09-12 DIAGNOSIS — C569 Malignant neoplasm of unspecified ovary: Secondary | ICD-10-CM | POA: Diagnosis not present

## 2017-09-15 ENCOUNTER — Encounter (HOSPITAL_COMMUNITY)
Admission: RE | Admit: 2017-09-15 | Discharge: 2017-09-15 | Disposition: A | Discharge status: Home / Self Care | Payer: Medicare Other | Source: Ambulatory Visit | Attending: Pulmonary Disease | Admitting: Pulmonary Disease

## 2017-09-15 DIAGNOSIS — C439 Malignant melanoma of skin, unspecified: Secondary | ICD-10-CM | POA: Insufficient documentation

## 2017-09-15 DIAGNOSIS — C569 Malignant neoplasm of unspecified ovary: Secondary | ICD-10-CM | POA: Diagnosis not present

## 2017-09-15 DIAGNOSIS — Z452 Encounter for adjustment and management of vascular access device: Secondary | ICD-10-CM | POA: Insufficient documentation

## 2017-09-15 MED ORDER — SODIUM CHLORIDE 0.9% FLUSH: 10.0000 mL | Freq: Once | INTRAVENOUS | Status: DC

## 2017-09-15 MED ORDER — HEPARIN SOD (PORK) LOCK FLUSH 100 UNIT/ML IV SOLN
500.0000 [IU] | Freq: Once | INTRAVENOUS | Status: AC
  Administered 2017-09-15: 500 [IU]

## 2017-09-18 DIAGNOSIS — R112 Nausea with vomiting, unspecified: Secondary | ICD-10-CM | POA: Diagnosis not present

## 2017-09-18 DIAGNOSIS — C569 Malignant neoplasm of unspecified ovary: Secondary | ICD-10-CM | POA: Diagnosis not present

## 2017-09-18 DIAGNOSIS — R531 Weakness: Secondary | ICD-10-CM | POA: Diagnosis not present

## 2017-09-18 DIAGNOSIS — R0602 Shortness of breath: Secondary | ICD-10-CM | POA: Diagnosis not present

## 2017-09-18 DIAGNOSIS — R52 Pain, unspecified: Secondary | ICD-10-CM | POA: Diagnosis not present

## 2017-09-18 DIAGNOSIS — I10 Essential (primary) hypertension: Secondary | ICD-10-CM | POA: Diagnosis not present

## 2017-09-18 DIAGNOSIS — K219 Gastro-esophageal reflux disease without esophagitis: Secondary | ICD-10-CM | POA: Diagnosis not present

## 2017-09-21 DIAGNOSIS — I10 Essential (primary) hypertension: Secondary | ICD-10-CM | POA: Diagnosis not present

## 2017-09-21 DIAGNOSIS — R112 Nausea with vomiting, unspecified: Secondary | ICD-10-CM | POA: Diagnosis not present

## 2017-09-21 DIAGNOSIS — K219 Gastro-esophageal reflux disease without esophagitis: Secondary | ICD-10-CM | POA: Diagnosis not present

## 2017-09-21 DIAGNOSIS — R52 Pain, unspecified: Secondary | ICD-10-CM | POA: Diagnosis not present

## 2017-09-21 DIAGNOSIS — C569 Malignant neoplasm of unspecified ovary: Secondary | ICD-10-CM | POA: Diagnosis not present

## 2017-09-21 DIAGNOSIS — R531 Weakness: Secondary | ICD-10-CM | POA: Diagnosis not present

## 2017-09-26 DIAGNOSIS — C569 Malignant neoplasm of unspecified ovary: Secondary | ICD-10-CM | POA: Diagnosis not present

## 2017-09-26 DIAGNOSIS — R531 Weakness: Secondary | ICD-10-CM | POA: Diagnosis not present

## 2017-09-26 DIAGNOSIS — K219 Gastro-esophageal reflux disease without esophagitis: Secondary | ICD-10-CM | POA: Diagnosis not present

## 2017-09-26 DIAGNOSIS — I10 Essential (primary) hypertension: Secondary | ICD-10-CM | POA: Diagnosis not present

## 2017-09-26 DIAGNOSIS — R112 Nausea with vomiting, unspecified: Secondary | ICD-10-CM | POA: Diagnosis not present

## 2017-09-26 DIAGNOSIS — R52 Pain, unspecified: Secondary | ICD-10-CM | POA: Diagnosis not present

## 2017-09-27 DIAGNOSIS — C569 Malignant neoplasm of unspecified ovary: Secondary | ICD-10-CM | POA: Diagnosis not present

## 2017-09-27 DIAGNOSIS — R52 Pain, unspecified: Secondary | ICD-10-CM | POA: Diagnosis not present

## 2017-09-27 DIAGNOSIS — R112 Nausea with vomiting, unspecified: Secondary | ICD-10-CM | POA: Diagnosis not present

## 2017-09-27 DIAGNOSIS — R531 Weakness: Secondary | ICD-10-CM | POA: Diagnosis not present

## 2017-09-27 DIAGNOSIS — I10 Essential (primary) hypertension: Secondary | ICD-10-CM | POA: Diagnosis not present

## 2017-09-27 DIAGNOSIS — K219 Gastro-esophageal reflux disease without esophagitis: Secondary | ICD-10-CM | POA: Diagnosis not present

## 2017-10-03 DIAGNOSIS — I10 Essential (primary) hypertension: Secondary | ICD-10-CM | POA: Diagnosis not present

## 2017-10-03 DIAGNOSIS — R112 Nausea with vomiting, unspecified: Secondary | ICD-10-CM | POA: Diagnosis not present

## 2017-10-03 DIAGNOSIS — R531 Weakness: Secondary | ICD-10-CM | POA: Diagnosis not present

## 2017-10-03 DIAGNOSIS — C569 Malignant neoplasm of unspecified ovary: Secondary | ICD-10-CM | POA: Diagnosis not present

## 2017-10-03 DIAGNOSIS — R52 Pain, unspecified: Secondary | ICD-10-CM | POA: Diagnosis not present

## 2017-10-03 DIAGNOSIS — K219 Gastro-esophageal reflux disease without esophagitis: Secondary | ICD-10-CM | POA: Diagnosis not present

## 2017-10-10 DIAGNOSIS — K219 Gastro-esophageal reflux disease without esophagitis: Secondary | ICD-10-CM | POA: Diagnosis not present

## 2017-10-10 DIAGNOSIS — I10 Essential (primary) hypertension: Secondary | ICD-10-CM | POA: Diagnosis not present

## 2017-10-10 DIAGNOSIS — R531 Weakness: Secondary | ICD-10-CM | POA: Diagnosis not present

## 2017-10-10 DIAGNOSIS — R52 Pain, unspecified: Secondary | ICD-10-CM | POA: Diagnosis not present

## 2017-10-10 DIAGNOSIS — R112 Nausea with vomiting, unspecified: Secondary | ICD-10-CM | POA: Diagnosis not present

## 2017-10-10 DIAGNOSIS — C569 Malignant neoplasm of unspecified ovary: Secondary | ICD-10-CM | POA: Diagnosis not present

## 2017-10-17 DIAGNOSIS — R52 Pain, unspecified: Secondary | ICD-10-CM | POA: Diagnosis not present

## 2017-10-17 DIAGNOSIS — R531 Weakness: Secondary | ICD-10-CM | POA: Diagnosis not present

## 2017-10-17 DIAGNOSIS — R112 Nausea with vomiting, unspecified: Secondary | ICD-10-CM | POA: Diagnosis not present

## 2017-10-17 DIAGNOSIS — I10 Essential (primary) hypertension: Secondary | ICD-10-CM | POA: Diagnosis not present

## 2017-10-17 DIAGNOSIS — K219 Gastro-esophageal reflux disease without esophagitis: Secondary | ICD-10-CM | POA: Diagnosis not present

## 2017-10-17 DIAGNOSIS — C569 Malignant neoplasm of unspecified ovary: Secondary | ICD-10-CM | POA: Diagnosis not present

## 2017-10-18 DIAGNOSIS — R112 Nausea with vomiting, unspecified: Secondary | ICD-10-CM | POA: Diagnosis not present

## 2017-10-18 DIAGNOSIS — R52 Pain, unspecified: Secondary | ICD-10-CM | POA: Diagnosis not present

## 2017-10-18 DIAGNOSIS — K219 Gastro-esophageal reflux disease without esophagitis: Secondary | ICD-10-CM | POA: Diagnosis not present

## 2017-10-18 DIAGNOSIS — D3910 Neoplasm of uncertain behavior of unspecified ovary: Secondary | ICD-10-CM | POA: Diagnosis not present

## 2017-10-18 DIAGNOSIS — R531 Weakness: Secondary | ICD-10-CM | POA: Diagnosis not present

## 2017-10-18 DIAGNOSIS — R0602 Shortness of breath: Secondary | ICD-10-CM | POA: Diagnosis not present

## 2017-10-18 DIAGNOSIS — I10 Essential (primary) hypertension: Secondary | ICD-10-CM | POA: Diagnosis not present

## 2017-10-18 DIAGNOSIS — M542 Cervicalgia: Secondary | ICD-10-CM | POA: Diagnosis not present

## 2017-10-18 DIAGNOSIS — M549 Dorsalgia, unspecified: Secondary | ICD-10-CM | POA: Diagnosis not present

## 2017-10-18 DIAGNOSIS — C569 Malignant neoplasm of unspecified ovary: Secondary | ICD-10-CM | POA: Diagnosis not present

## 2017-10-25 DIAGNOSIS — K219 Gastro-esophageal reflux disease without esophagitis: Secondary | ICD-10-CM | POA: Diagnosis not present

## 2017-10-25 DIAGNOSIS — C569 Malignant neoplasm of unspecified ovary: Secondary | ICD-10-CM | POA: Diagnosis not present

## 2017-10-25 DIAGNOSIS — R531 Weakness: Secondary | ICD-10-CM | POA: Diagnosis not present

## 2017-10-25 DIAGNOSIS — I10 Essential (primary) hypertension: Secondary | ICD-10-CM | POA: Diagnosis not present

## 2017-10-25 DIAGNOSIS — R52 Pain, unspecified: Secondary | ICD-10-CM | POA: Diagnosis not present

## 2017-10-25 DIAGNOSIS — R112 Nausea with vomiting, unspecified: Secondary | ICD-10-CM | POA: Diagnosis not present

## 2017-10-31 DIAGNOSIS — K219 Gastro-esophageal reflux disease without esophagitis: Secondary | ICD-10-CM | POA: Diagnosis not present

## 2017-10-31 DIAGNOSIS — I10 Essential (primary) hypertension: Secondary | ICD-10-CM | POA: Diagnosis not present

## 2017-10-31 DIAGNOSIS — R112 Nausea with vomiting, unspecified: Secondary | ICD-10-CM | POA: Diagnosis not present

## 2017-10-31 DIAGNOSIS — R531 Weakness: Secondary | ICD-10-CM | POA: Diagnosis not present

## 2017-10-31 DIAGNOSIS — R52 Pain, unspecified: Secondary | ICD-10-CM | POA: Diagnosis not present

## 2017-10-31 DIAGNOSIS — C569 Malignant neoplasm of unspecified ovary: Secondary | ICD-10-CM | POA: Diagnosis not present

## 2017-11-07 DIAGNOSIS — R52 Pain, unspecified: Secondary | ICD-10-CM | POA: Diagnosis not present

## 2017-11-07 DIAGNOSIS — R531 Weakness: Secondary | ICD-10-CM | POA: Diagnosis not present

## 2017-11-07 DIAGNOSIS — R112 Nausea with vomiting, unspecified: Secondary | ICD-10-CM | POA: Diagnosis not present

## 2017-11-07 DIAGNOSIS — K219 Gastro-esophageal reflux disease without esophagitis: Secondary | ICD-10-CM | POA: Diagnosis not present

## 2017-11-07 DIAGNOSIS — I10 Essential (primary) hypertension: Secondary | ICD-10-CM | POA: Diagnosis not present

## 2017-11-07 DIAGNOSIS — C569 Malignant neoplasm of unspecified ovary: Secondary | ICD-10-CM | POA: Diagnosis not present

## 2017-11-09 DIAGNOSIS — C569 Malignant neoplasm of unspecified ovary: Secondary | ICD-10-CM | POA: Diagnosis not present

## 2017-11-09 DIAGNOSIS — R52 Pain, unspecified: Secondary | ICD-10-CM | POA: Diagnosis not present

## 2017-11-09 DIAGNOSIS — I10 Essential (primary) hypertension: Secondary | ICD-10-CM | POA: Diagnosis not present

## 2017-11-09 DIAGNOSIS — K219 Gastro-esophageal reflux disease without esophagitis: Secondary | ICD-10-CM | POA: Diagnosis not present

## 2017-11-09 DIAGNOSIS — R531 Weakness: Secondary | ICD-10-CM | POA: Diagnosis not present

## 2017-11-09 DIAGNOSIS — R112 Nausea with vomiting, unspecified: Secondary | ICD-10-CM | POA: Diagnosis not present

## 2017-11-14 DIAGNOSIS — R531 Weakness: Secondary | ICD-10-CM | POA: Diagnosis not present

## 2017-11-14 DIAGNOSIS — K219 Gastro-esophageal reflux disease without esophagitis: Secondary | ICD-10-CM | POA: Diagnosis not present

## 2017-11-14 DIAGNOSIS — R112 Nausea with vomiting, unspecified: Secondary | ICD-10-CM | POA: Diagnosis not present

## 2017-11-14 DIAGNOSIS — I10 Essential (primary) hypertension: Secondary | ICD-10-CM | POA: Diagnosis not present

## 2017-11-14 DIAGNOSIS — C569 Malignant neoplasm of unspecified ovary: Secondary | ICD-10-CM | POA: Diagnosis not present

## 2017-11-14 DIAGNOSIS — R52 Pain, unspecified: Secondary | ICD-10-CM | POA: Diagnosis not present

## 2017-11-17 ENCOUNTER — Encounter (HOSPITAL_COMMUNITY)
Admission: RE | Admit: 2017-11-17 | Discharge: 2017-11-17 | Disposition: A | Discharge status: Home / Self Care | Payer: Medicare Other | Source: Ambulatory Visit | Attending: Pulmonary Disease | Admitting: Pulmonary Disease

## 2017-11-17 DIAGNOSIS — C439 Malignant melanoma of skin, unspecified: Secondary | ICD-10-CM | POA: Insufficient documentation

## 2017-11-17 DIAGNOSIS — Z452 Encounter for adjustment and management of vascular access device: Secondary | ICD-10-CM | POA: Insufficient documentation

## 2017-11-17 DIAGNOSIS — C569 Malignant neoplasm of unspecified ovary: Secondary | ICD-10-CM | POA: Insufficient documentation

## 2017-11-17 MED ORDER — HEPARIN SOD (PORK) LOCK FLUSH 100 UNIT/ML IV SOLN
500.0000 [IU] | Freq: Once | INTRAVENOUS | Status: AC
Start: 2017-11-17 — End: 2017-11-17
  Administered 2017-11-17: 500 [IU] via INTRAVENOUS

## 2017-11-18 DIAGNOSIS — R52 Pain, unspecified: Secondary | ICD-10-CM | POA: Diagnosis not present

## 2017-11-18 DIAGNOSIS — C569 Malignant neoplasm of unspecified ovary: Secondary | ICD-10-CM | POA: Diagnosis not present

## 2017-11-18 DIAGNOSIS — R531 Weakness: Secondary | ICD-10-CM | POA: Diagnosis not present

## 2017-11-18 DIAGNOSIS — K219 Gastro-esophageal reflux disease without esophagitis: Secondary | ICD-10-CM | POA: Diagnosis not present

## 2017-11-18 DIAGNOSIS — R0602 Shortness of breath: Secondary | ICD-10-CM | POA: Diagnosis not present

## 2017-11-18 DIAGNOSIS — R112 Nausea with vomiting, unspecified: Secondary | ICD-10-CM | POA: Diagnosis not present

## 2017-11-18 DIAGNOSIS — I10 Essential (primary) hypertension: Secondary | ICD-10-CM | POA: Diagnosis not present

## 2017-11-21 DIAGNOSIS — K219 Gastro-esophageal reflux disease without esophagitis: Secondary | ICD-10-CM | POA: Diagnosis not present

## 2017-11-21 DIAGNOSIS — I10 Essential (primary) hypertension: Secondary | ICD-10-CM | POA: Diagnosis not present

## 2017-11-21 DIAGNOSIS — R112 Nausea with vomiting, unspecified: Secondary | ICD-10-CM | POA: Diagnosis not present

## 2017-11-21 DIAGNOSIS — C569 Malignant neoplasm of unspecified ovary: Secondary | ICD-10-CM | POA: Diagnosis not present

## 2017-11-21 DIAGNOSIS — R531 Weakness: Secondary | ICD-10-CM | POA: Diagnosis not present

## 2017-11-21 DIAGNOSIS — R52 Pain, unspecified: Secondary | ICD-10-CM | POA: Diagnosis not present

## 2017-11-23 IMAGING — MR MR ABDOMEN WO/W CM
19 series · 47 of 48 positions shown · IV contrast (10ml Multihance)
Comparison: CT on 02/21/2015

CLINICAL DATA: Followup metastatic left ovarian granulosa cell
carcinoma. Elevated tumor markers.

EXAM:
MRI ABDOMEN AND PELVIS WITHOUT AND WITH CONTRAST
TECHNIQUE: Multiplanar multisequence MR imaging of the abdomen and pelvis was
performed both before and after the administration of intravenous
contrast.
CONTRAST:  10mL MULTIHANCE GADOBENATE DIMEGLUMINE 529 MG/ML IV SOLN

[Series 3: t2fs axial blade · axial · 4.0mm · 1.16mm/px · 1 of 52 slices shown]
[im 1/52]
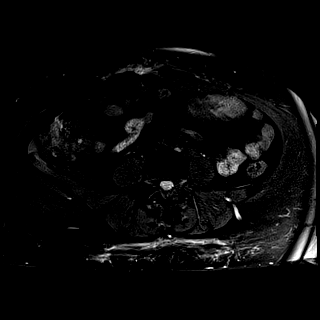

[Series 4: T1 · axial · 4.0mm · 0.59mm/px · 1 of 64 slices shown (1 of 3)]
[im 1/64]
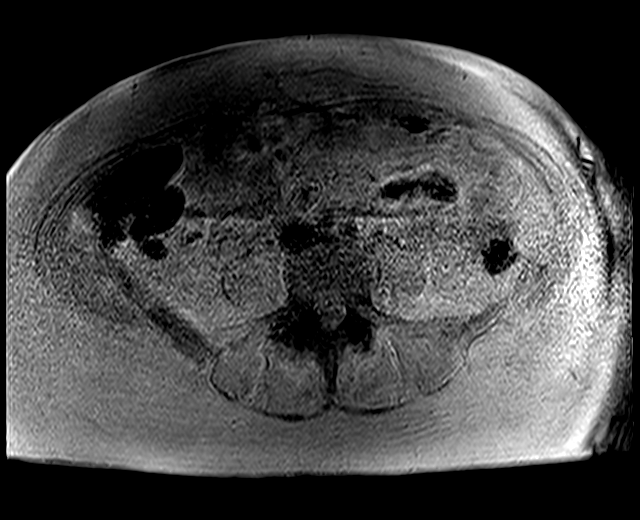

[Series 5: T1 · axial · 4.0mm · 0.59mm/px · 1 of 64 slices shown (2 of 3)]
[im 1/64]
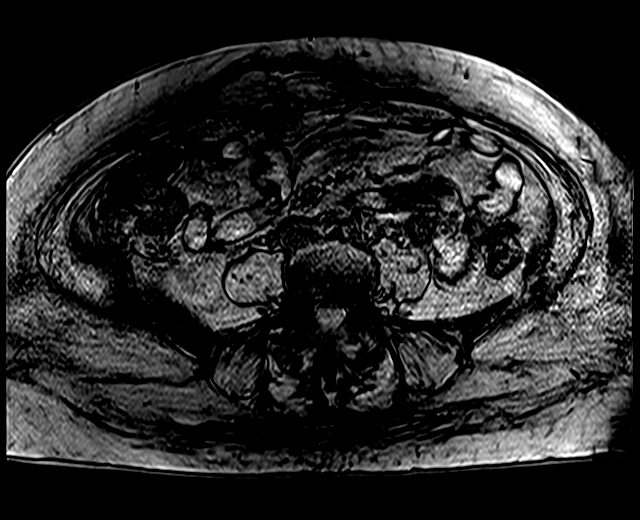

[Series 7: T1 · axial · 4.0mm · 0.59mm/px · z∈[+64,+316]mm · 2 of 64 slices shown (3 of 3)]
[im 1/64]
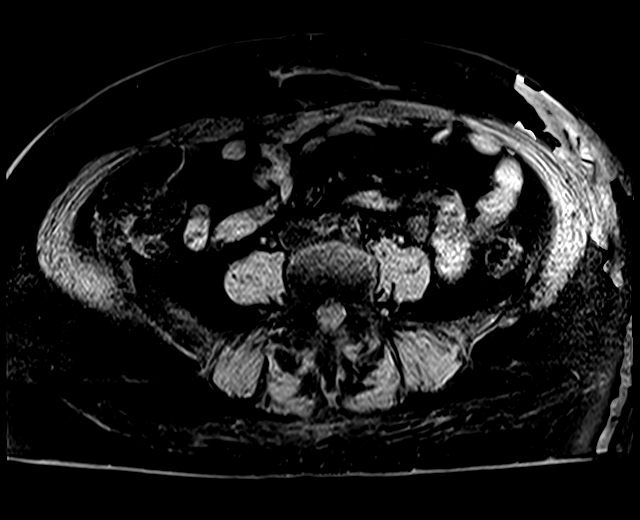
[im 64/64]
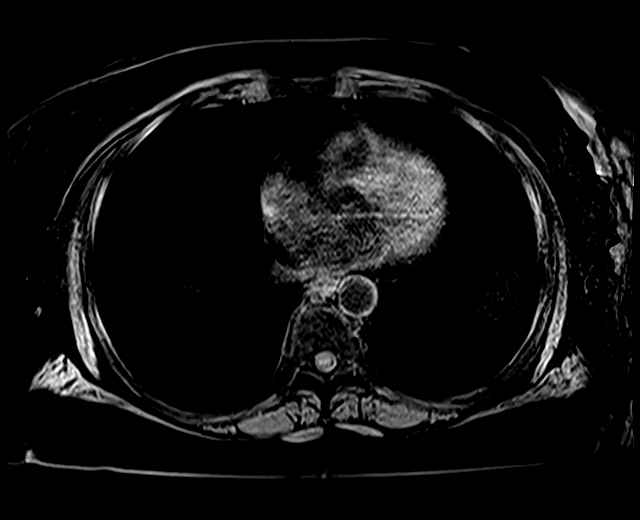

[Series 8: DWI · axial · 5.0mm · 1.98mm/px · z∈[+38,+332]mm · 5 of 149 slices shown (1 of 2)]
[im 1/149]
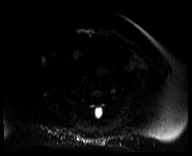
[im 38/149]
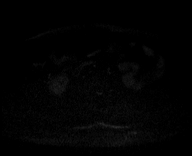
[im 75/149]
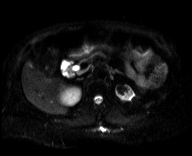
[im 112/149]
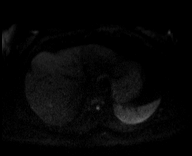
[im 149/149]
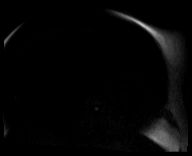

[Series 9: DWI · axial · 5.0mm · 1.98mm/px · z∈[+38,+332]mm · 2 of 50 slices shown (2 of 2)]
[im 1/50]
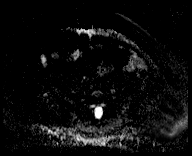
[im 50/50]
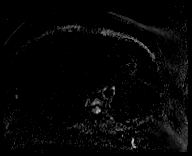

[Series 10: bSSFP · axial · 4.0mm · 1.37mm/px · z∈[+1,+317]mm · 3 of 80 slices shown]
[im 1/80]
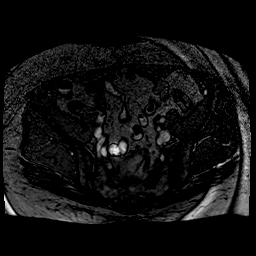
[im 40/80]
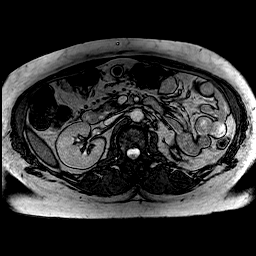
[im 80/80]
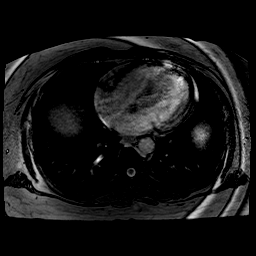

[Series 11: T2 · coronal · 5.0mm · 1.36mm/px · 1 of 36 slices shown (1 of 2)]
[im 1/36]
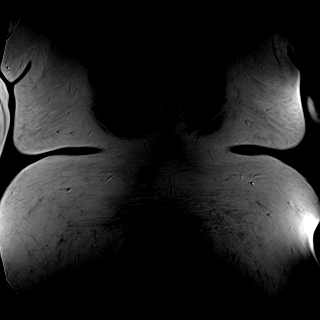

[Series 12: pre test · axial · non-contrast · 3.8mm · 1.25mm/px · z∈[+35,+332]mm · 3 of 80 slices shown]
[im 1/80]
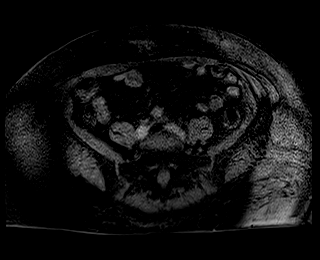
[im 40/80]
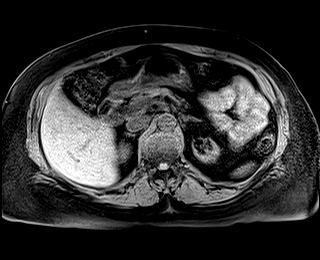
[im 80/80]
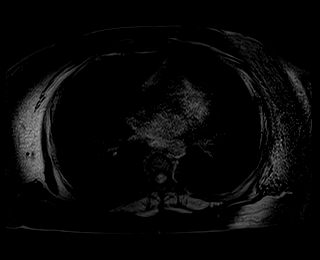

[Series 20: t1fs coronal post · coronal · 2.5mm · 1.39mm/px · 3 of 96 slices shown]
[im 1/96]
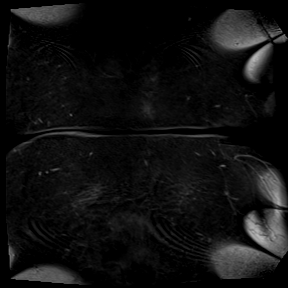
[im 48/96]
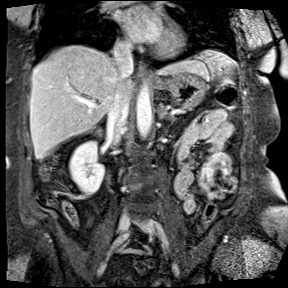
[im 96/96]
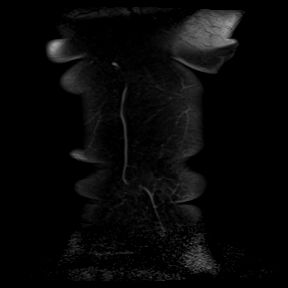

[Series 21: T2 · axial · 5.0mm · 1.48mm/px · z∈[+5,+329]mm · 2 of 55 slices shown (2 of 2)]
[im 1/55]
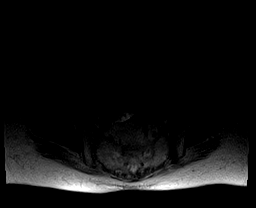
[im 55/55]
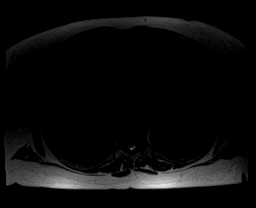

[Series 22: 5 min delay · axial · delayed · 3.8mm · 1.25mm/px · z∈[+35,+332]mm · 3 of 80 slices shown]
[im 1/80]
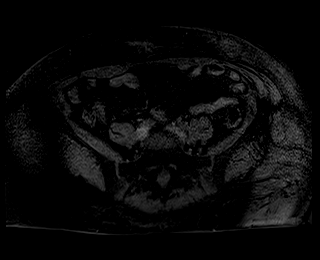
[im 40/80]
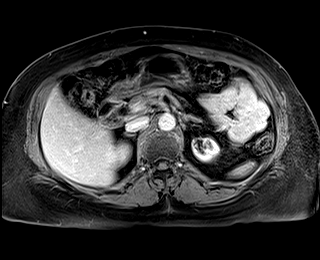
[im 80/80]
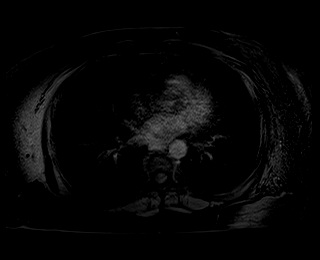

[Series 23: 5 min delay_sub · axial · 3.8mm · 1.25mm/px · z∈[+35,+332]mm · 3 of 80 slices shown]
[im 1/80]
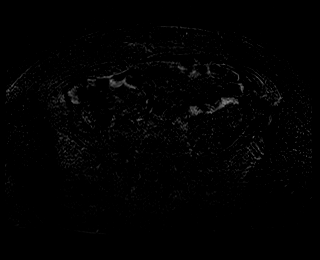
[im 40/80]
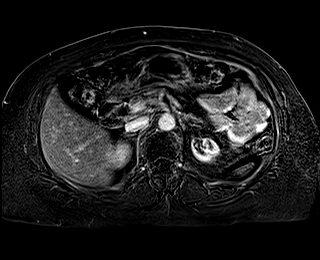
[im 80/80]
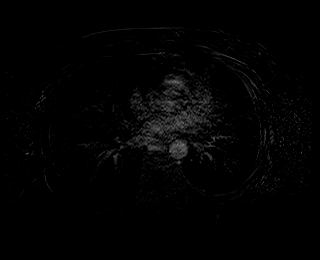

[Series 5004: (id) · axial · 3.8mm · 1.25mm/px · z∈[+35,+332]mm · 3 of 80 slices shown (1 of 6)]
[im 1/80]
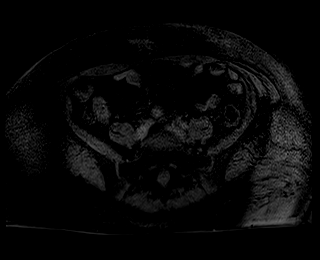
[im 40/80]
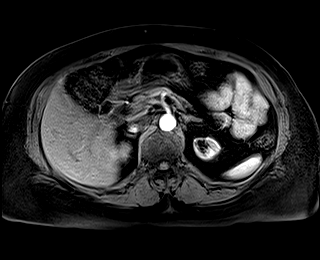
[im 80/80]
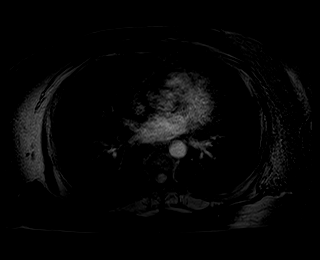

[Series 5005: (id) · axial · 3.8mm · 1.26mm/px · z∈[+35,+332]mm · 3 of 80 slices shown (2 of 6)]
[im 1/80]
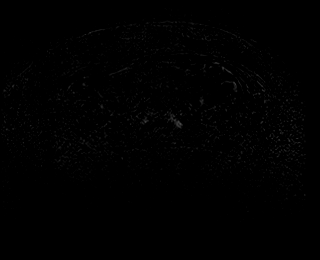
[im 40/80]
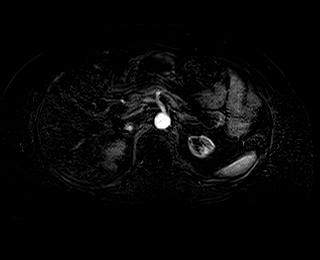
[im 80/80]
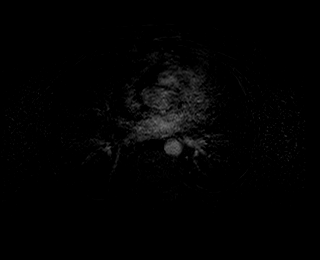

[Series 5006: (id) · axial · 3.8mm · 1.25mm/px · z∈[+35,+332]mm · 3 of 80 slices shown (3 of 6)]
[im 1/80]
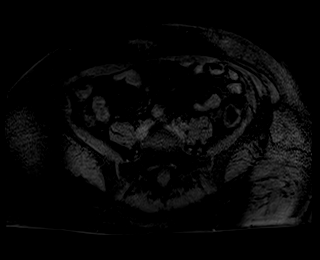
[im 40/80]
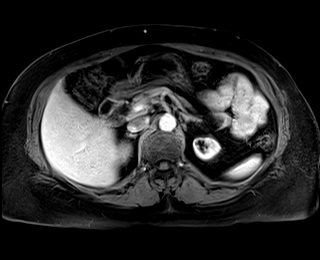
[im 80/80]
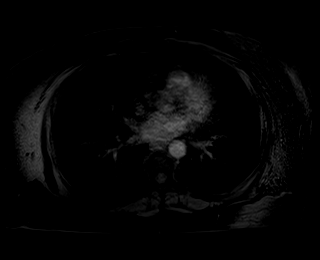

[Series 5007: (id) · axial · 3.8mm · 1.25mm/px · z∈[+35,+332]mm · 3 of 80 slices shown (4 of 6)]
[im 1/80]
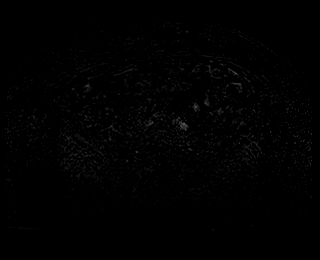
[im 40/80]
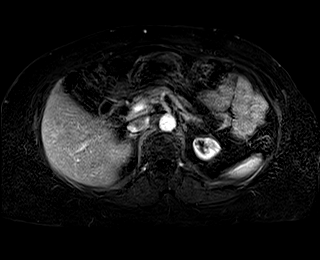
[im 80/80]
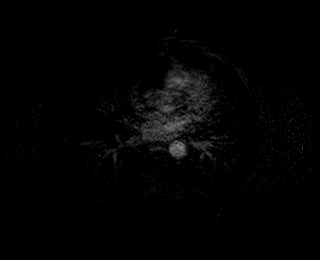

[Series 5008: (id) · axial · 3.8mm · 1.25mm/px · z∈[+35,+332]mm · 3 of 80 slices shown (5 of 6)]
[im 1/80]
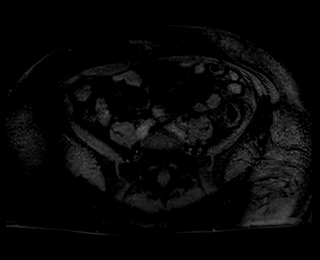
[im 40/80]
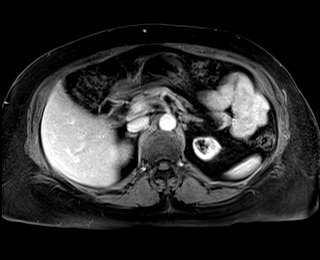
[im 80/80]
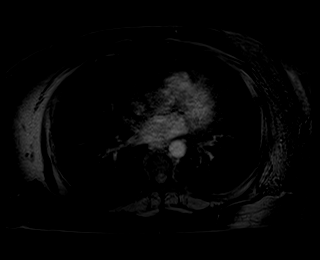

[Series 5009: (id) · axial · 3.8mm · 1.25mm/px · z∈[+35,+182]mm · 2 of 80 slices shown (6 of 6)]
[im 1/80]
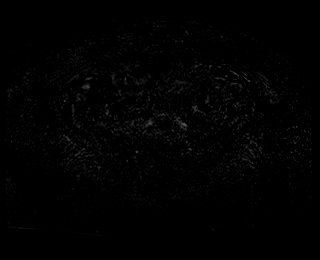
[im 40/80]
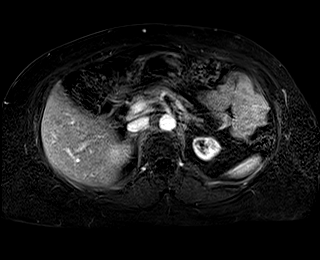

[47 of 48 positions shown; findings below may reference images not displayed]

FINDINGS: Lower Chest: No acute findings.

Hepatobiliary: Although comparison is limited by differences in
modalities, several small liver metastases show no definite change.
Largest metastasis in the posterior right hepatic lobe measures
cm on image 28/series 0996. No definite new or enlarging liver
metastases identified. Prior cholecystectomy noted. Stable mild
diffuse biliary ductal dilatation, likely due to prior
cholecystectomy.

Pancreas:  No mass or inflammatory changes.

Spleen: Within normal limits in size and appearance.

Adrenals/Urinary Tract: Small left adrenal adenoma remains stable.
Right adrenal gland is normal in appearance. Diffuse left renal
parenchymal atrophy is again seen, without evidence of mass or
hydronephrosis. Normal appearance of right kidney.

Stomach/Bowel: No evidence of obstruction, inflammatory process or
abnormal fluid collections. Sigmoid diverticulosis is noted, without
evidence of diverticulitis.

Vascular/Lymphatic: No pathologically enlarged lymph nodes within
the abdomen or pelvis. No abdominal aortic aneurysm.

Reproductive: Previous hysterectomy. Vaginal cuff is normal in
appearance. No adnexal or other pelvic masses are identified. No
evidence of ascites.

Other: Stable small midline ventral abdominal wall hernia containing
only fat.

Musculoskeletal:  No suspicious bone lesions identified.
IMPRESSION: Small hepatic metastases show no significant change allowing for
differences in modalities compared to previous CT.

No new or progressive metastatic disease identified within the
abdomen or pelvis.

Stable small midline ventral abdominal wall hernia containing only
fat.

Stable small benign left adrenal adenoma and diffuse left renal
parenchymal atrophy. No evidence of hydronephrosis.

## 2017-11-29 DIAGNOSIS — R112 Nausea with vomiting, unspecified: Secondary | ICD-10-CM | POA: Diagnosis not present

## 2017-11-29 DIAGNOSIS — J441 Chronic obstructive pulmonary disease with (acute) exacerbation: Secondary | ICD-10-CM | POA: Diagnosis not present

## 2017-11-29 DIAGNOSIS — C569 Malignant neoplasm of unspecified ovary: Secondary | ICD-10-CM | POA: Diagnosis not present

## 2017-11-29 DIAGNOSIS — R531 Weakness: Secondary | ICD-10-CM | POA: Diagnosis not present

## 2017-11-29 DIAGNOSIS — K219 Gastro-esophageal reflux disease without esophagitis: Secondary | ICD-10-CM | POA: Diagnosis not present

## 2017-11-29 DIAGNOSIS — K210 Gastro-esophageal reflux disease with esophagitis: Secondary | ICD-10-CM | POA: Diagnosis not present

## 2017-11-29 DIAGNOSIS — R52 Pain, unspecified: Secondary | ICD-10-CM | POA: Diagnosis not present

## 2017-11-29 DIAGNOSIS — I10 Essential (primary) hypertension: Secondary | ICD-10-CM | POA: Diagnosis not present

## 2017-11-29 DIAGNOSIS — C561 Malignant neoplasm of right ovary: Secondary | ICD-10-CM | POA: Diagnosis not present

## 2017-12-05 DIAGNOSIS — R531 Weakness: Secondary | ICD-10-CM | POA: Diagnosis not present

## 2017-12-05 DIAGNOSIS — I10 Essential (primary) hypertension: Secondary | ICD-10-CM | POA: Diagnosis not present

## 2017-12-05 DIAGNOSIS — C569 Malignant neoplasm of unspecified ovary: Secondary | ICD-10-CM | POA: Diagnosis not present

## 2017-12-05 DIAGNOSIS — K219 Gastro-esophageal reflux disease without esophagitis: Secondary | ICD-10-CM | POA: Diagnosis not present

## 2017-12-05 DIAGNOSIS — R52 Pain, unspecified: Secondary | ICD-10-CM | POA: Diagnosis not present

## 2017-12-05 DIAGNOSIS — R112 Nausea with vomiting, unspecified: Secondary | ICD-10-CM | POA: Diagnosis not present

## 2017-12-12 DIAGNOSIS — K219 Gastro-esophageal reflux disease without esophagitis: Secondary | ICD-10-CM | POA: Diagnosis not present

## 2017-12-12 DIAGNOSIS — I10 Essential (primary) hypertension: Secondary | ICD-10-CM | POA: Diagnosis not present

## 2017-12-12 DIAGNOSIS — R531 Weakness: Secondary | ICD-10-CM | POA: Diagnosis not present

## 2017-12-12 DIAGNOSIS — R112 Nausea with vomiting, unspecified: Secondary | ICD-10-CM | POA: Diagnosis not present

## 2017-12-12 DIAGNOSIS — R52 Pain, unspecified: Secondary | ICD-10-CM | POA: Diagnosis not present

## 2017-12-12 DIAGNOSIS — C569 Malignant neoplasm of unspecified ovary: Secondary | ICD-10-CM | POA: Diagnosis not present

## 2017-12-14 DIAGNOSIS — K219 Gastro-esophageal reflux disease without esophagitis: Secondary | ICD-10-CM | POA: Diagnosis not present

## 2017-12-14 DIAGNOSIS — I10 Essential (primary) hypertension: Secondary | ICD-10-CM | POA: Diagnosis not present

## 2017-12-14 DIAGNOSIS — R52 Pain, unspecified: Secondary | ICD-10-CM | POA: Diagnosis not present

## 2017-12-14 DIAGNOSIS — R531 Weakness: Secondary | ICD-10-CM | POA: Diagnosis not present

## 2017-12-14 DIAGNOSIS — C569 Malignant neoplasm of unspecified ovary: Secondary | ICD-10-CM | POA: Diagnosis not present

## 2017-12-14 DIAGNOSIS — R112 Nausea with vomiting, unspecified: Secondary | ICD-10-CM | POA: Diagnosis not present

## 2017-12-16 DIAGNOSIS — K219 Gastro-esophageal reflux disease without esophagitis: Secondary | ICD-10-CM | POA: Diagnosis not present

## 2017-12-16 DIAGNOSIS — R52 Pain, unspecified: Secondary | ICD-10-CM | POA: Diagnosis not present

## 2017-12-16 DIAGNOSIS — I10 Essential (primary) hypertension: Secondary | ICD-10-CM | POA: Diagnosis not present

## 2017-12-16 DIAGNOSIS — R112 Nausea with vomiting, unspecified: Secondary | ICD-10-CM | POA: Diagnosis not present

## 2017-12-16 DIAGNOSIS — R531 Weakness: Secondary | ICD-10-CM | POA: Diagnosis not present

## 2017-12-16 DIAGNOSIS — C569 Malignant neoplasm of unspecified ovary: Secondary | ICD-10-CM | POA: Diagnosis not present

## 2017-12-17 DIAGNOSIS — K219 Gastro-esophageal reflux disease without esophagitis: Secondary | ICD-10-CM | POA: Diagnosis not present

## 2017-12-17 DIAGNOSIS — R112 Nausea with vomiting, unspecified: Secondary | ICD-10-CM | POA: Diagnosis not present

## 2017-12-17 DIAGNOSIS — I10 Essential (primary) hypertension: Secondary | ICD-10-CM | POA: Diagnosis not present

## 2017-12-17 DIAGNOSIS — R52 Pain, unspecified: Secondary | ICD-10-CM | POA: Diagnosis not present

## 2017-12-17 DIAGNOSIS — C569 Malignant neoplasm of unspecified ovary: Secondary | ICD-10-CM | POA: Diagnosis not present

## 2017-12-17 DIAGNOSIS — R531 Weakness: Secondary | ICD-10-CM | POA: Diagnosis not present

## 2018-01-19 ENCOUNTER — Encounter (HOSPITAL_COMMUNITY): Admission: RE | Admit: 2018-01-19 | Payer: Medicare Other | Source: Ambulatory Visit

## 2018-01-24 ENCOUNTER — Encounter (HOSPITAL_COMMUNITY)
Admission: RE | Admit: 2018-01-24 | Discharge: 2018-01-24 | Disposition: A | Discharge status: Home / Self Care | Payer: Medicare Other | Source: Ambulatory Visit | Attending: Pulmonary Disease | Admitting: Pulmonary Disease

## 2018-01-24 DIAGNOSIS — D649 Anemia, unspecified: Secondary | ICD-10-CM | POA: Diagnosis not present

## 2018-01-24 MED ORDER — HEPARIN SOD (PORK) LOCK FLUSH 100 UNIT/ML IV SOLN
500.0000 [IU] | Freq: Once | INTRAVENOUS | Status: AC
  Administered 2018-01-24: 500 [IU] via INTRAVENOUS

## 2018-02-03 ENCOUNTER — Encounter (HOSPITAL_COMMUNITY): Payer: Self-pay

## 2018-02-03 ENCOUNTER — Encounter (HOSPITAL_COMMUNITY)
Admission: RE | Admit: 2018-02-03 | Discharge: 2018-02-03 | Disposition: A | Discharge status: Home / Self Care | Payer: Medicare Other | Source: Ambulatory Visit | Attending: Pulmonary Disease | Admitting: Pulmonary Disease

## 2018-02-03 ENCOUNTER — Observation Stay (HOSPITAL_COMMUNITY)
Admission: AD | Admit: 2018-02-03 | Discharge: 2018-02-04 | Disposition: A | Discharge status: Home / Self Care | Payer: Medicare Other | Source: Ambulatory Visit | Attending: Pulmonary Disease | Admitting: Pulmonary Disease

## 2018-02-03 ENCOUNTER — Other Ambulatory Visit: Payer: Self-pay

## 2018-02-03 DIAGNOSIS — T451X5A Adverse effect of antineoplastic and immunosuppressive drugs, initial encounter: Secondary | ICD-10-CM | POA: Diagnosis present

## 2018-02-03 DIAGNOSIS — C787 Secondary malignant neoplasm of liver and intrahepatic bile duct: Secondary | ICD-10-CM | POA: Diagnosis present

## 2018-02-03 DIAGNOSIS — F17210 Nicotine dependence, cigarettes, uncomplicated: Secondary | ICD-10-CM | POA: Diagnosis not present

## 2018-02-03 DIAGNOSIS — J449 Chronic obstructive pulmonary disease, unspecified: Secondary | ICD-10-CM | POA: Diagnosis not present

## 2018-02-03 DIAGNOSIS — D649 Anemia, unspecified: Secondary | ICD-10-CM | POA: Diagnosis present

## 2018-02-03 DIAGNOSIS — J45909 Unspecified asthma, uncomplicated: Secondary | ICD-10-CM | POA: Insufficient documentation

## 2018-02-03 DIAGNOSIS — Z95828 Presence of other vascular implants and grafts: Secondary | ICD-10-CM

## 2018-02-03 DIAGNOSIS — F17209 Nicotine dependence, unspecified, with unspecified nicotine-induced disorders: Secondary | ICD-10-CM | POA: Diagnosis present

## 2018-02-03 DIAGNOSIS — G620 Drug-induced polyneuropathy: Secondary | ICD-10-CM | POA: Diagnosis present

## 2018-02-03 DIAGNOSIS — C7960 Secondary malignant neoplasm of unspecified ovary: Secondary | ICD-10-CM | POA: Insufficient documentation

## 2018-02-03 HISTORY — DX: Malignant neoplasm of unspecified ovary: C56.9

## 2018-02-03 LAB — CBC
HCT: 18.6 % — ABNORMAL LOW (ref 36.0–46.0)
Hemoglobin: 4.6 g/dL — CL (ref 12.0–15.0)
MCH: 20.9 pg — AB (ref 26.0–34.0)
MCHC: 24.7 g/dL — ABNORMAL LOW (ref 30.0–36.0)
MCV: 84.5 fL (ref 80.0–100.0)
Platelets: 350 10*3/uL (ref 150–400)
RBC: 2.2 MIL/uL — ABNORMAL LOW (ref 3.87–5.11)
RDW: 21.6 % — ABNORMAL HIGH (ref 11.5–15.5)
WBC: 10.2 10*3/uL (ref 4.0–10.5)
nRBC: 0 % (ref 0.0–0.2)

## 2018-02-03 LAB — ABO/RH: ABO/RH(D): A NEG

## 2018-02-03 LAB — PREPARE RBC (CROSSMATCH)

## 2018-02-03 LAB — OCCULT BLOOD X 1 CARD TO LAB, STOOL: Fecal Occult Bld: NEGATIVE

## 2018-02-03 MED ORDER — ALPRAZOLAM 0.5 MG PO TABS: 0.5000 mg | ORAL_TABLET | ORAL | Status: DC | PRN

## 2018-02-03 MED ORDER — FAMOTIDINE 20 MG PO TABS
20.0000 mg | ORAL_TABLET | Freq: Two times a day (BID) | ORAL | Status: DC
  Administered 2018-02-03 – 2018-02-04 (×2): 20 mg via ORAL
  Filled 2018-02-03 (×3): qty 1

## 2018-02-03 MED ORDER — SODIUM CHLORIDE 0.9 % IV SOLN: INTRAVENOUS | Status: DC

## 2018-02-03 MED ORDER — FUROSEMIDE 10 MG/ML IJ SOLN
20.0000 mg | Freq: Once | INTRAMUSCULAR | Status: AC
  Administered 2018-02-03: 20 mg via INTRAVENOUS
  Filled 2018-02-03: qty 2

## 2018-02-03 MED ORDER — DIPHENHYDRAMINE HCL 50 MG/ML IJ SOLN
25.0000 mg | Freq: Once | INTRAMUSCULAR | Status: AC
  Administered 2018-02-03: 25 mg via INTRAVENOUS
  Filled 2018-02-03: qty 1

## 2018-02-03 MED ORDER — ACETAMINOPHEN 325 MG PO TABS
650.0000 mg | ORAL_TABLET | Freq: Once | ORAL | Status: AC
  Administered 2018-02-03: 650 mg via ORAL
  Filled 2018-02-03: qty 2

## 2018-02-03 MED ORDER — FENTANYL 25 MCG/HR TD PT72
1.0000 | MEDICATED_PATCH | TRANSDERMAL | Status: DC
  Filled 2018-02-03: qty 1

## 2018-02-03 MED ORDER — ONDANSETRON HCL 4 MG PO TABS: 4.0000 mg | ORAL_TABLET | Freq: Three times a day (TID) | ORAL | Status: DC | PRN

## 2018-02-03 MED ORDER — ONDANSETRON HCL 4 MG/2ML IJ SOLN: 4.0000 mg | Freq: Four times a day (QID) | INTRAMUSCULAR | Status: DC | PRN

## 2018-02-03 MED ORDER — HYDROCODONE-ACETAMINOPHEN 10-325 MG PO TABS
1.0000 | ORAL_TABLET | ORAL | Status: DC | PRN
  Administered 2018-02-04 (×2): 1 via ORAL
  Filled 2018-02-03 (×2): qty 1

## 2018-02-03 MED ORDER — SODIUM CHLORIDE 0.9% IV SOLUTION
Freq: Once | INTRAVENOUS | Status: AC
  Administered 2018-02-03: 13:00:00 via INTRAVENOUS

## 2018-02-03 NOTE — Progress Notes (Signed)
Patient sent to have STAT CBC, T/S and T/C, as she received a 4.6 result drawn at her cardiology office.  Port accessed and blood return noted.  Flushed with saline and 500 units of heparin.  Will leave accessed.  O2 applied for comfort.  Spoke to [place] at Dr. Luan Pulling office and advised that patient would like to wait over here, if possible to until plan made.  States it is ok.  Will call results as soon as they are available.

## 2018-02-03 NOTE — H&P (Signed)
She was admitted from the office with symptomatic anemia. Hemoglobin of 4. She has ovarian cancer with hospice care. Full note to follow

## 2018-02-03 NOTE — Progress Notes (Signed)
Called [facility] 4.6 to Dr. Luan Pulling.  Advised to send patient to his office for assessment and direct admit.  Patient verbalized understanding.

## 2018-02-04 DIAGNOSIS — D649 Anemia, unspecified: Secondary | ICD-10-CM | POA: Diagnosis not present

## 2018-02-04 LAB — CBC
HCT: 28.4 % — ABNORMAL LOW (ref 36.0–46.0)
Hemoglobin: 8.1 g/dL — ABNORMAL LOW (ref 12.0–15.0)
MCH: 25.2 pg — ABNORMAL LOW (ref 26.0–34.0)
MCHC: 28.5 g/dL — ABNORMAL LOW (ref 30.0–36.0)
MCV: 88.5 fL (ref 80.0–100.0)
Platelets: 319 10*3/uL (ref 150–400)
RBC: 3.21 MIL/uL — ABNORMAL LOW (ref 3.87–5.11)
RDW: 19.2 % — ABNORMAL HIGH (ref 11.5–15.5)
WBC: 12 10*3/uL — ABNORMAL HIGH (ref 4.0–10.5)
nRBC: 0 % (ref 0.0–0.2)

## 2018-02-04 LAB — HEMOGLOBIN AND HEMATOCRIT, BLOOD
HEMATOCRIT: 29.2 % — AB (ref 36.0–46.0)
Hemoglobin: 8.4 g/dL — ABNORMAL LOW (ref 12.0–15.0)

## 2018-02-04 LAB — PREPARE RBC (CROSSMATCH)

## 2018-02-04 MED ORDER — DIPHENHYDRAMINE HCL 25 MG PO CAPS
25.0000 mg | ORAL_CAPSULE | Freq: Once | ORAL | Status: AC
  Administered 2018-02-04: 25 mg via ORAL
  Filled 2018-02-04: qty 1

## 2018-02-04 MED ORDER — SODIUM CHLORIDE 0.9% IV SOLUTION
Freq: Once | INTRAVENOUS | Status: AC
  Administered 2018-02-04: 11:00:00 via INTRAVENOUS

## 2018-02-04 MED ORDER — HEPARIN SOD (PORK) LOCK FLUSH 100 UNIT/ML IV SOLN: 500.0000 [IU] | INTRAVENOUS | Status: DC | PRN

## 2018-02-04 MED ORDER — ACETAMINOPHEN 325 MG PO TABS
650.0000 mg | ORAL_TABLET | Freq: Once | ORAL | Status: AC
  Administered 2018-02-04: 650 mg via ORAL
  Filled 2018-02-04: qty 2

## 2018-02-04 MED ORDER — HEPARIN SOD (PORK) LOCK FLUSH 100 UNIT/ML IV SOLN
500.0000 [IU] | Freq: Once | INTRAVENOUS | Status: DC
  Filled 2018-02-04: qty 5

## 2018-02-04 NOTE — H&P (Signed)
Felicia Wallace MRN: [id number] DOB/AGE: 53-08-1965 53 y.o. Primary Care Physician:Brixton Franko, Percell Miller, MD Admit date: 02/03/2018 Chief Complaint: Anemia HPI: This is a 53 year old who had blood drawn prior to her oncology appointment and was found to have low hemoglobin.  Because it had been a drastic change I had her go by the specialty clinic and it was still low at about 4.6.  This is documentation of history and physical performed in my office on 02/03/2018.  I had her come to the office and she was very symptomatic very weak had difficulty walking and we discussed transfusion and she agrees.  She has not had any bleeding.  She does have abdominal pain and that is from her known metastatic ovarian cancer.  No chest pain.  She is nauseated but no vomiting.  No diarrhea.  No headache.  Past Medical History:  Diagnosis Date  . Asthma   . COPD (chronic obstructive pulmonary disease) ([place])   . Fibromyalgia   . Osteoarthritis   . Ovarian cancer [facility])    Past Surgical History:  Procedure Laterality Date  . ABDOMINAL HYSTERECTOMY    . KNEE ARTHROSCOPY     Right  . MELANOMA EXCISION     Left arm  . TONSILLECTOMY AND ADENOIDECTOMY    . TUBAL LIGATION    . UMBILICAL HERNIA REPAIR          Family History  Problem Relation Age of Onset  . Coronary artery disease Mother   . Lung cancer Father     Social History:  reports that she has been smoking. She has been smoking about 1.50 packs per day. She has never used smokeless tobacco. She reports that she does not use drugs. No history on file for alcohol.   Allergies:  Allergies  Allergen Reactions  . Cefazolin Hives    REACTION: skin rash  . Penicillins Hives    REACTION: skin rash  . Bleomycin Dermatitis    She had diffuse redness and sores of her palms fingers and severe ulcerations of her mouth and difficulty swallowing due to the pain  . Morphine Nausea And Vomiting    REACTION: Nausea  . Other Other (See Comments) and Nausea And  Vomiting    Pain medication - headaches **PERCOCET** Pain medication - headaches **PERCOCET**  . Sulfa Antibiotics Nausea And Vomiting    Medications Prior to Admission  Medication Sig Dispense Refill  . ALPRAZolam (XANAX) 0.5 MG tablet Take 0.5 mg by mouth 3 (three) times daily.     Marland Kitchen amLODipine (NORVASC) 10 MG tablet Take 10 mg by mouth daily.     . cholecalciferol (VITAMIN D) 1000 units tablet Take 1,000 Units by mouth daily.     . Cyanocobalamin (VITAMIN B 12 PO) Take 1,000 mcg by mouth daily.    Marland Kitchen docusate sodium (COLACE) 100 MG capsule Take 100 mg by mouth daily as needed.     . fentaNYL (DURAGESIC - DOSED MCG/HR) 12 MCG/HR Place 12-24 mcg onto the skin See admin instructions. Apply one patch on day one, one patch on day two, no patch on day 3 and repeat process replacing old patch.    . gabapentin (NEURONTIN) 600 MG tablet Take 600 mg by mouth 2 (two) times daily.    Marland Kitchen HYDROcodone-acetaminophen (NORCO) 10-325 MG tablet Take 1-2 tablets by mouth every 6 (six) hours as needed.     . Ipratropium-Albuterol (COMBIVENT) 20-100 MCG/ACT AERS respimat Inhale 1 puff into the lungs every 6 (six) hours as needed for  shortness of breath.     . lactulose (CHRONULAC) 10 GM/15ML solution Take 30 g by mouth daily.     Marland Kitchen LORazepam (ATIVAN) 1 MG tablet Take 1 mg by mouth every 4 (four) hours as needed (nausea).     . losartan (COZAAR) 50 MG tablet Take 50 mg by mouth daily.     . ondansetron (ZOFRAN) 8 MG tablet Take 8 mg by mouth 2 (two) times daily.     . sertraline (ZOLOFT) 50 MG tablet Take 50 mg by mouth daily.    . sodium bicarbonate 650 MG tablet Take 650 mg by mouth 2 (two) times daily.     Marland Kitchen topiramate (TOPAMAX) 50 MG tablet Take 100 mg by mouth every evening.    . chlorhexidine (PERIDEX) 0.12 % solution Use as directed 15 mLs in the mouth or throat 2 (two) times daily. (Patient not taking: Reported on 02/04/2018) 946 mL 1  . megestrol (MEGACE) 40 MG tablet Take 1 BID for 2 weeks and then  Tamoxifen for 2 weeks and continue to alternate    . Misc Natural Products (TURMERIC CURCUMIN) CAPS Take by mouth daily.         JGG:EZMOQ from the symptoms mentioned above,there are no other symptoms referable to all systems reviewed.  Otherwise 10 point review of systems is negative  Physical Exam: Blood pressure 138/80, pulse (!) 104, temperature 98 F (36.7 C), temperature source Axillary, resp. rate 20, height 5\' 4"  (1.626 m), weight 79.9 kg, SpO2 100 %. Constitutional: She is awake and alert and looks very weak.  She is very pale.  Eyes: Her conjunctive are pale.  Pupils react.  Ears nose mouth and throat: She has dry mouth.  Cardiovascular: Heart is regular with mild tachycardia.  Respiratory: Respiratory effort is normal and her lungs are clear.  Gastrointestinal: Her abdomen is soft with no masses.  Skin: Warm and dry musculoskeletal: She has diffuse muscle weakness.  Psychiatric: She appears mildly anxious   Recent Labs    02/03/18 1028 02/04/18 0230 02/04/18 0618  WBC 10.2  --  12.0*  HGB 4.6* 8.4* 8.1*  HCT 18.6* 29.2* 28.4*  MCV 84.5  --  88.5  PLT 350  --  319   No results for input(s): NA, K, CL, CO2, GLUCOSE, BUN, CREATININE, CALCIUM, MG in the last 72 hours.  Invalid input(s): PHOlablast2(ast:2,ALT:2,alkphos:2,bilitot:2,prot:2,albumin:2)@    No results found for this or any previous visit (from the past 240 hour(s)).   No results found. Impression: She has symptomatic anemia.  She is not bleeding.  This may be associated with chemotherapy or her liver disease. Active Problems:   Portacath in place   Tobacco use disorder, continuous   Chemotherapy-induced peripheral neuropathy ([place])   Metastasis to liver ([place])   Symptomatic anemia     Plan: She will receive blood transfusion and home when stable      Felicia Wallace   02/04/2018, 9:56 AM

## 2018-02-04 NOTE — Discharge Summary (Signed)
Physician Discharge Summary  Patient ID: Felicia Wallace MRN: [id number] DOB/AGE: [birth year] 53 y.o. Primary Care Physician:Patsye Sullivant, Percell Miller, MD Admit date: 02/03/2018 Discharge date: 02/04/2018    Discharge Diagnoses:   Active Problems:   Portacath in place   Tobacco use disorder, continuous   Chemotherapy-induced peripheral neuropathy ([facility])   Metastasis to liver ([facility])   Symptomatic anemia   Allergies as of 02/04/2018      Reactions   Cefazolin Hives   REACTION: skin rash   Penicillins Hives   REACTION: skin rash   Bleomycin Dermatitis   She had diffuse redness and sores of her palms fingers and severe ulcerations of her mouth and difficulty swallowing due to the pain   Morphine Nausea And Vomiting   REACTION: Nausea   Other Other (See Comments), Nausea And Vomiting   Pain medication - headaches **PERCOCET** Pain medication - headaches **PERCOCET**   Sulfa Antibiotics Nausea And Vomiting      Medication List    STOP taking these medications   chlorhexidine 0.12 % solution Commonly known as:  PERIDEX   megestrol 40 MG tablet Commonly known as:  MEGACE   Turmeric Curcumin Caps     TAKE these medications   ALPRAZolam 0.5 MG tablet Commonly known as:  XANAX Take 0.5 mg by mouth 3 (three) times daily.   amLODipine 10 MG tablet Commonly known as:  NORVASC Take 10 mg by mouth daily.   cholecalciferol 25 MCG (1000 UT) tablet Commonly known as:  VITAMIN D Take 1,000 Units by mouth daily.   docusate sodium 100 MG capsule Commonly known as:  COLACE Take 100 mg by mouth daily as needed.   fentaNYL 12 MCG/HR Commonly known as:  DURAGESIC Place 12-24 mcg onto the skin See admin instructions. Apply one patch on day one, one patch on day two, no patch on day 3 and repeat process replacing old patch.   gabapentin 600 MG tablet Commonly known as:  NEURONTIN Take 600 mg by mouth 2 (two) times daily.   HYDROcodone-acetaminophen 10-325 MG tablet Commonly known as:   NORCO Take 1-2 tablets by mouth every 6 (six) hours as needed.   Ipratropium-Albuterol 20-100 MCG/ACT Aers respimat Commonly known as:  COMBIVENT Inhale 1 puff into the lungs every 6 (six) hours as needed for shortness of breath.   lactulose 10 GM/15ML solution Commonly known as:  CHRONULAC Take 30 g by mouth daily.   LORazepam 1 MG tablet Commonly known as:  ATIVAN Take 1 mg by mouth every 4 (four) hours as needed (nausea).   losartan 50 MG tablet Commonly known as:  COZAAR Take 50 mg by mouth daily.   ondansetron 8 MG tablet Commonly known as:  ZOFRAN Take 8 mg by mouth 2 (two) times daily.   sertraline 50 MG tablet Commonly known as:  ZOLOFT Take 50 mg by mouth daily.   sodium bicarbonate 650 MG tablet Take 650 mg by mouth 2 (two) times daily.   topiramate 50 MG tablet Commonly known as:  TOPAMAX Take 100 mg by mouth every evening.   VITAMIN B 12 PO Take 1,000 mcg by mouth daily.       Discharged Condition: Improved    Consults:none  Significant Diagnostic Studies: No results found.  Lab Results: Basic Metabolic Panel: No results for input(s): NA, K, CL, CO2, GLUCOSE, BUN, CREATININE, CALCIUM, MG, PHOS in the last 72 hours. Liver Function Tests: No results for input(s): AST, ALT, ALKPHOS, BILITOT, PROT, ALBUMIN in the last 72 hours.  CBC: Recent Labs    02/03/18 1028 02/04/18 0230 02/04/18 0618  WBC 10.2  --  12.0*  HGB 4.6* 8.4* 8.1*  HCT 18.6* 29.2* 28.4*  MCV 84.5  --  88.5  PLT 350  --  319    No results found for this or any previous visit (from the past 240 hour(s)).   Hospital Course: This is a patient who came to my office with hemoglobin of about 4.6.  She has cancer of the ovary and has been receiving treatment but is now with hospice.  She was intensely symptomatic so I brought her in for observation and blood transfusion as we could not do that as an outpatient.  She received a total of 4 units of packed red blood cells and felt  much better by the time of discharge.  Discharge Exam: Blood pressure 138/80, pulse (!) 104, temperature 98 F (36.7 C), temperature source Axillary, resp. rate 20, height 5\' 4"  (1.626 m), weight 79.9 kg, SpO2 100 %. She is awake and alert.  Chest is clear.  Heart is regular.  Abdomen is soft  Disposition: Home to resume hospice care      Signed: [name]   02/04/2018, 10:02 AM

## 2018-02-04 NOTE — Progress Notes (Signed)
Subjective: She was admitted from our office yesterday with symptomatic anemia with hemoglobin of 4.6.  She is known to have ovarian cancer with metastatic disease and she is working with hospice.  She was severely weak and short of breath with her low hemoglobin so she was admitted for blood transfusion since we could not safely do that as an outpatient.  This morning she says she feels better but her hemoglobin level still less than 9 so would like to see her get 1 more unit of blood before she goes home.  She wants to go home after the unit of blood  Objective: Vital signs in last 24 hours: Temp:  [97.4 F (36.3 C)-99.1 F (37.3 C)] 98 F (36.7 C) (01/18 0500) Pulse Rate:  [77-104] 104 (01/18 0500) Resp:  [14-20] 20 (01/18 0500) BP: (113-138)/(64-90) 138/80 (01/18 0500) SpO2:  [97 %-100 %] 100 % (01/18 0500) Weight:  [79.9 kg] 79.9 kg (01/17 1203) Weight change:  Last BM Date: 02/03/17  Intake/Output from previous day: 01/17 0701 - 01/18 0700 In: 2361.5 [P.O.:720; I.V.:11.5; Blood:1630] Out: 1000 [Urine:1000]  PHYSICAL EXAM General appearance: alert, cooperative and no distress Resp: clear to auscultation bilaterally Cardio: regular rate and rhythm, S1, S2 normal, no murmur, click, rub or gallop GI: soft, non-tender; bowel sounds normal; no masses,  no organomegaly Extremities: extremities normal, atraumatic, no cyanosis or edema  Lab Results:  Results for orders placed or performed during the hospital encounter of 02/03/18 (from the past 48 hour(s))  Occult blood card to lab, stool RN will collect     Status: None   Collection Time: 02/03/18  8:00 PM  Result Value Ref Range   Fecal Occult Bld NEGATIVE NEGATIVE    Comment: Performed at [facility], [address], [place], [place] [address]  Hemoglobin and hematocrit, blood     Status: Abnormal   Collection Time: 02/04/18  2:30 AM  Result Value Ref Range   Hemoglobin 8.4 (L) 12.0 - 15.0 g/dL    Comment: POST TRANSFUSION  SPECIMEN   HCT 29.2 (L) 36.0 - 46.0 %    Comment: Performed at [facility], [address], [place], [place] [address]  CBC     Status: Abnormal   Collection Time: 02/04/18  6:18 AM  Result Value Ref Range   WBC 12.0 (H) 4.0 - 10.5 K/uL   RBC 3.21 (L) 3.87 - 5.11 MIL/uL   Hemoglobin 8.1 (L) 12.0 - 15.0 g/dL   HCT 28.4 (L) 36.0 - 46.0 %   MCV 88.5 80.0 - 100.0 fL   MCH 25.2 (L) 26.0 - 34.0 pg   MCHC 28.5 (L) 30.0 - 36.0 g/dL   RDW 19.2 (H) 11.5 - 15.5 %   Platelets 319 150 - 400 K/uL   nRBC 0.0 0.0 - 0.2 %    Comment: Performed at [facility], [address] [place], [place] [address]    ABGS No results for input(s): PHART, PO2ART, TCO2, HCO3 in the last 72 hours.  Invalid input(s): PCO2 CULTURES No results found for this or any previous visit (from the past 240 hour(s)). Studies/Results: No results found.  Medications:  Prior to Admission:  Medications Prior to Admission  Medication Sig Dispense Refill Last Dose  . ALPRAZolam (XANAX) 0.5 MG tablet Take 0.5 mg by mouth 3 (three) times daily.    02/03/2018 at Unknown time  . amLODipine (NORVASC) 10 MG tablet Take 10 mg by mouth daily.    02/03/2018 at Unknown time  . cholecalciferol (  VITAMIN D) 1000 units tablet Take 1,000 Units by mouth daily.    unknown  . Cyanocobalamin (VITAMIN B 12 PO) Take 1,000 mcg by mouth daily.   02/03/2018 at Unknown time  . docusate sodium (COLACE) 100 MG capsule Take 100 mg by mouth daily as needed.    02/03/2018 at Unknown time  . fentaNYL (DURAGESIC - DOSED MCG/HR) 12 MCG/HR Place 12-24 mcg onto the skin See admin instructions. Apply one patch on day one, one patch on day two, no patch on day 3 and repeat process replacing old patch.   02/03/2018 at Unknown time  . gabapentin (NEURONTIN) 600 MG tablet Take 600 mg by mouth 2 (two) times daily.   02/03/2018 at Unknown time  . HYDROcodone-acetaminophen (NORCO) 10-325 MG tablet Take 1-2 tablets by mouth every 6 (six) hours as needed.    02/03/2018 at Unknown  time  . Ipratropium-Albuterol (COMBIVENT) 20-100 MCG/ACT AERS respimat Inhale 1 puff into the lungs every 6 (six) hours as needed for shortness of breath.    Past Week at Unknown time  . lactulose (CHRONULAC) 10 GM/15ML solution Take 30 g by mouth daily.    02/03/2018 at Unknown time  . LORazepam (ATIVAN) 1 MG tablet Take 1 mg by mouth every 4 (four) hours as needed (nausea).    Past Month at Unknown time  . losartan (COZAAR) 50 MG tablet Take 50 mg by mouth daily.    02/03/2018 at Unknown time  . ondansetron (ZOFRAN) 8 MG tablet Take 8 mg by mouth 2 (two) times daily.    02/03/2018 at Unknown time  . sertraline (ZOLOFT) 50 MG tablet Take 50 mg by mouth daily.   02/03/2018 at Unknown time  . sodium bicarbonate 650 MG tablet Take 650 mg by mouth 2 (two) times daily.    02/03/2018 at Unknown time  . topiramate (TOPAMAX) 50 MG tablet Take 100 mg by mouth every evening.   02/03/2018 at Unknown time  . chlorhexidine (PERIDEX) 0.12 % solution Use as directed 15 mLs in the mouth or throat 2 (two) times daily. (Patient not taking: Reported on 02/04/2018) 946 mL 1 Not Taking at Unknown time  . megestrol (MEGACE) 40 MG tablet Take 1 BID for 2 weeks and then Tamoxifen for 2 weeks and continue to alternate   Not Taking at Unknown time  . Misc Natural Products (TURMERIC CURCUMIN) CAPS Take by mouth daily.   Not Taking at Unknown time   Scheduled: . famotidine  20 mg Oral BID  . fentaNYL  1 patch Transdermal Q72H   Continuous: . sodium chloride     ION:[id number], HYDROcodone-acetaminophen, ondansetron (ZOFRAN) IV, ondansetron  Assesment: She has symptomatic anemia.  She will receive 1 more unit of blood and then she will be able to go home. Active Problems:   Symptomatic anemia    Plan: As above    LOS: 0 days   [name] 02/04/2018, 9:50 AM

## 2018-02-05 LAB — TYPE AND SCREEN
ABO/RH(D): A NEG
ANTIBODY SCREEN: NEGATIVE
UNIT DIVISION: 0
Unit division: 0
Unit division: 0
Unit division: 0
Unit division: 0

## 2018-02-05 LAB — BPAM RBC
BLOOD PRODUCT EXPIRATION DATE: 202002182359
Blood Product Expiration Date: 202001282359
Blood Product Expiration Date: 202002152359
Blood Product Expiration Date: 202002202359
Blood Product Expiration Date: 202002222359
ISSUE DATE / TIME: 202001171401
ISSUE DATE / TIME: 202001171433
ISSUE DATE / TIME: 202001171654
ISSUE DATE / TIME: 202001172011
ISSUE DATE / TIME: 202001181101
UNIT TYPE AND RH: 600
Unit Type and Rh: 600
Unit Type and Rh: 600
Unit Type and Rh: 600
Unit Type and Rh: 600

## 2018-03-07 ENCOUNTER — Encounter (HOSPITAL_COMMUNITY)
Admission: RE | Admit: 2018-03-07 | Discharge: 2018-03-07 | Disposition: A | Discharge status: Home / Self Care | Payer: Medicare Other | Source: Ambulatory Visit | Attending: Pulmonary Disease | Admitting: Pulmonary Disease

## 2018-03-07 DIAGNOSIS — D649 Anemia, unspecified: Secondary | ICD-10-CM | POA: Insufficient documentation

## 2018-03-20 ENCOUNTER — Encounter (HOSPITAL_COMMUNITY): Payer: Self-pay

## 2018-03-28 ENCOUNTER — Encounter (HOSPITAL_COMMUNITY): Payer: Self-pay

## 2018-04-05 ENCOUNTER — Other Ambulatory Visit: Payer: Self-pay

## 2018-04-05 ENCOUNTER — Encounter (HOSPITAL_COMMUNITY)
Admission: RE | Admit: 2018-04-05 | Discharge: 2018-04-05 | Disposition: A | Discharge status: Home / Self Care | Payer: Medicare Other | Source: Ambulatory Visit | Attending: Pulmonary Disease | Admitting: Pulmonary Disease

## 2018-04-05 DIAGNOSIS — D649 Anemia, unspecified: Secondary | ICD-10-CM | POA: Diagnosis present

## 2018-04-05 MED ORDER — HEPARIN SOD (PORK) LOCK FLUSH 100 UNIT/ML IV SOLN
500.0000 [IU] | Freq: Once | INTRAVENOUS | Status: AC
  Administered 2018-04-05: 500 [IU] via INTRAVENOUS

## 2018-05-17 ENCOUNTER — Inpatient Hospital Stay (HOSPITAL_COMMUNITY): Admission: RE | Admit: 2018-05-17 | Payer: Self-pay | Source: Ambulatory Visit

## 2018-06-05 ENCOUNTER — Encounter: Payer: Self-pay | Admitting: Oncology

## 2018-06-19 DEATH — deceased
